# Patient Record
Sex: Male | Born: 1985
Health system: Southern US, Community
[De-identification: ages and names within clinical notes are randomized; demographics above are authoritative.]

## PROBLEM LIST (undated history)

## (undated) DIAGNOSIS — I219 Acute myocardial infarction, unspecified: Secondary | ICD-10-CM

## (undated) DIAGNOSIS — K219 Gastro-esophageal reflux disease without esophagitis: Secondary | ICD-10-CM

## (undated) DIAGNOSIS — R079 Chest pain, unspecified: Secondary | ICD-10-CM

## (undated) DIAGNOSIS — R002 Palpitations: Secondary | ICD-10-CM

## (undated) DIAGNOSIS — R9431 Abnormal electrocardiogram [ECG] [EKG]: Secondary | ICD-10-CM

## (undated) DIAGNOSIS — Z9289 Personal history of other medical treatment: Secondary | ICD-10-CM

## (undated) HISTORY — DX: Abnormal electrocardiogram (ECG) (EKG): R94.31

## (undated) HISTORY — DX: Morbid (severe) obesity due to excess calories: E66.01

## (undated) HISTORY — DX: Palpitations: R00.2

## (undated) HISTORY — DX: Chest pain, unspecified: R07.9

## (undated) HISTORY — DX: Personal history of other medical treatment: Z92.89

## (undated) HISTORY — DX: Acute myocardial infarction, unspecified: I21.9

---

## 2005-03-06 DIAGNOSIS — I219 Acute myocardial infarction, unspecified: Secondary | ICD-10-CM

## 2005-03-06 HISTORY — DX: Acute myocardial infarction, unspecified: I21.9

## 2015-05-13 ENCOUNTER — Ambulatory Visit: Payer: 59 | Admitting: Family Medicine

## 2015-05-28 ENCOUNTER — Encounter: Payer: Self-pay | Admitting: Family Medicine

## 2015-05-28 ENCOUNTER — Ambulatory Visit (INDEPENDENT_AMBULATORY_CARE_PROVIDER_SITE_OTHER): Payer: 59 | Admitting: Family Medicine

## 2015-05-28 VITALS — BP 114/72 | HR 70 | Temp 98.1°F | Ht 69.5 in | Wt 275.4 lb

## 2015-05-28 DIAGNOSIS — I252 Old myocardial infarction: Secondary | ICD-10-CM

## 2015-05-28 DIAGNOSIS — J069 Acute upper respiratory infection, unspecified: Secondary | ICD-10-CM

## 2015-05-28 DIAGNOSIS — Z130 Encounter for screening for diseases of the blood and blood-forming organs and certain disorders involving the immune mechanism: Secondary | ICD-10-CM | POA: Diagnosis not present

## 2015-05-28 DIAGNOSIS — E669 Obesity, unspecified: Secondary | ICD-10-CM | POA: Diagnosis not present

## 2015-05-28 NOTE — Assessment & Plan Note (Signed)
BMI of 40. Patient reports loss of 30 pounds recently. Is working diligently at diet and exercise. Patient will continue to work at diet and exercise. We will check screening lab work. He'll follow-up in 3 months to see how he is progressing.

## 2015-05-28 NOTE — Assessment & Plan Note (Signed)
Resolved. We'll monitor for recurrence.

## 2015-05-28 NOTE — Patient Instructions (Signed)
Nice to meet you. We will check lab work today as screening lab work. Please continue with diet and exercises you have been. You're doing a great job. If you develop chest pain, shortness of breath, sweatiness, or any new or changing symptoms please seek medical attention.

## 2015-05-28 NOTE — Assessment & Plan Note (Signed)
Patient reports history of MI at age 30. He had a reported negative catheterization. Felt to be related to decongestant use. He has not been followed by cardiologist since that time. Notes no issues since that time. No symptoms now. He will continue to monitor. We will obtain screening lab work. He is given return precautions.

## 2015-05-28 NOTE — Progress Notes (Signed)
Pre visit review using our clinic review tool, if applicable. No additional management support is needed unless otherwise documented below in the visit note. 

## 2015-05-28 NOTE — Progress Notes (Signed)
Patient ID: John Snow, male   DOB: [birth year], 30 y.o.   MRN: [id number]  John Rumps, MD Phone: [phone]  John Snow is a 30 y.o. male who presents today for new patient visit.  Patient presents to establish care. He reports he has a history of a MI at age 81. He developed chest pain, shortness of breath, numbness of his left arm and diaphoresis at that time and was evaluated in the emergency room. He states he was admitted and had a catheterization that revealed no blockages. He states they believe it might have been related to him being on decongestants at that time. He has not had any recurrent symptoms since that time. He exercises 3-4 times a week and has no symptoms.  Obesity: Patient is working very hard on losing weight. He states he is down 30 pounds. He has started to monitor his diet and is eating mostly vegetables and white meats. 3 meals a day. Eating healthy snacks in between. Maybe 1 sweet tea a month. Exercises 3-4 times a week at the gym. Works at [employer].  Sinus congestion: Patient notes about a month ago he had a week of sinus congestion and headaches. No fevers. No cough. Had rhinorrhea. Better on its own. No symptoms at this time.  Active Ambulatory Problems    Diagnosis Date Noted  . History of MI (myocardial infarction) 05/28/2015  . Acute upper respiratory infection 05/28/2015  . Obesity 05/28/2015   Resolved Ambulatory Problems    Diagnosis Date Noted  . No Resolved Ambulatory Problems   Past Medical History  Diagnosis Date  . Heart attack ([employer]) 2007    Family History  Problem Relation Age of Onset  . Cancer Father 35    Not prostate cancer  . Hypertension Maternal Grandmother     Social History   Social History  . Marital Status: Married    Spouse Name: N/A  . Number of Children: N/A  . Years of Education: N/A   Occupational History  . Not on file.   Social History Main Topics  . Smoking status: Never Smoker   .  Smokeless tobacco: Not on file  . Alcohol Use: No  . Drug Use: No  . Sexual Activity: Not on file   Other Topics Concern  . Not on file   Social History Narrative  . No narrative on file    ROS   General:  Negative for nexplained weight loss, fever Skin: Negative for new or changing mole, sore that won't heal HEENT: Negative for trouble hearing, trouble seeing, ringing in ears, mouth sores, hoarseness, change in voice, dysphagia. CV:  Negative for chest pain, dyspnea, edema, palpitations Resp: Negative for cough, dyspnea, hemoptysis GI: Negative for nausea, vomiting, diarrhea, constipation, abdominal pain, melena, hematochezia. GU: Negative for dysuria, incontinence, urinary hesitance, hematuria, vaginal or penile discharge, polyuria, sexual difficulty, lumps in testicle or breasts MSK: Negative for muscle cramps or aches, joint pain or swelling Neuro: Negative for headaches, weakness, numbness, dizziness, passing out/fainting Psych: Negative for depression, anxiety, memory problems  Objective  Physical Exam Filed Vitals:   05/28/15 1418  BP: 114/72  Pulse: 70  Temp: 98.1 F (36.7 C)    BP Readings from Last 3 Encounters:  05/28/15 114/72   Wt Readings from Last 3 Encounters:  05/28/15 275 lb 6.4 oz (124.921 kg)    Physical Exam  Constitutional: He is well-developed, well-nourished, and in no distress.  HENT:  Head: Normocephalic and atraumatic.  Right Ear: External  ear normal.  Left Ear: External ear normal.  Mouth/Throat: Oropharynx is clear and moist. No oropharyngeal exudate.  Eyes: Conjunctivae are normal. Pupils are equal, round, and reactive to light.  Neck: Neck supple.  Cardiovascular: Normal rate and regular rhythm.  Exam reveals no gallop and no friction rub.   No murmur heard. Pulmonary/Chest: Effort normal and breath sounds normal. No respiratory distress. He has no wheezes. He has no rales.  Abdominal: Soft. Bowel sounds are normal. He exhibits no  distension. There is no tenderness. There is no rebound and no guarding.  Musculoskeletal: He exhibits no edema.  Lymphadenopathy:    He has no cervical adenopathy.  Neurological: He is alert. Gait normal.  Skin: Skin is warm and dry. He is not diaphoretic.  Psychiatric: Mood and affect normal.     Assessment/Plan:   History of MI (myocardial infarction) Patient reports history of MI at age 59. He had a reported negative catheterization. Felt to be related to decongestant use. He has not been followed by cardiologist since that time. Notes no issues since that time. No symptoms now. He will continue to monitor. We will obtain screening lab work. He is given return precautions.  Acute upper respiratory infection Resolved. We'll monitor for recurrence.  Obesity BMI of 40. Patient reports loss of 30 pounds recently. Is working diligently at diet and exercise. Patient will continue to work at diet and exercise. We will check screening lab work. He'll follow-up in 3 months to see how he is progressing.    Orders Placed This Encounter  Procedures  . Lipid Profile  . Comp Met (CMET)  . CBC  . HgB A1c  . TSH    John Rumps, MD [place]

## 2015-05-31 ENCOUNTER — Other Ambulatory Visit: Payer: 59

## 2015-06-30 NOTE — Progress Notes (Signed)
Patient has voicemail that has not been set up

## 2015-07-14 ENCOUNTER — Encounter: Payer: Self-pay | Admitting: *Deleted

## 2015-07-14 ENCOUNTER — Emergency Department
Admission: EM | Admit: 2015-07-14 | Discharge: 2015-07-14 | Disposition: A | Discharge status: Home / Self Care | Payer: 59 | Attending: Student | Admitting: Student

## 2015-07-14 DIAGNOSIS — I252 Old myocardial infarction: Secondary | ICD-10-CM | POA: Insufficient documentation

## 2015-07-14 DIAGNOSIS — Z8674 Personal history of sudden cardiac arrest: Secondary | ICD-10-CM | POA: Diagnosis not present

## 2015-07-14 DIAGNOSIS — E669 Obesity, unspecified: Secondary | ICD-10-CM | POA: Insufficient documentation

## 2015-07-14 DIAGNOSIS — R42 Dizziness and giddiness: Secondary | ICD-10-CM | POA: Diagnosis present

## 2015-07-14 LAB — URINALYSIS COMPLETE WITH MICROSCOPIC (ARMC ONLY)
BILIRUBIN URINE: NEGATIVE
Bacteria, UA: NONE SEEN
GLUCOSE, UA: NEGATIVE mg/dL
KETONES UR: NEGATIVE mg/dL
Leukocytes, UA: NEGATIVE
NITRITE: NEGATIVE
PROTEIN: NEGATIVE mg/dL
SPECIFIC GRAVITY, URINE: 1.014 (ref 1.005–1.030)
pH: 6 (ref 5.0–8.0)

## 2015-07-14 LAB — CBC
HCT: 47.3 % (ref 40.0–52.0)
Hemoglobin: 15.6 g/dL (ref 13.0–18.0)
MCH: 29.6 pg (ref 26.0–34.0)
MCHC: 33.1 g/dL (ref 32.0–36.0)
MCV: 89.5 fL (ref 80.0–100.0)
PLATELETS: 288 10*3/uL (ref 150–440)
RBC: 5.29 MIL/uL (ref 4.40–5.90)
RDW: 13.3 % (ref 11.5–14.5)
WBC: 10.9 10*3/uL — ABNORMAL HIGH (ref 3.8–10.6)

## 2015-07-14 LAB — BASIC METABOLIC PANEL
ANION GAP: 8 (ref 5–15)
BUN: 21 mg/dL — ABNORMAL HIGH (ref 6–20)
CALCIUM: 9.5 mg/dL (ref 8.9–10.3)
CO2: 25 mmol/L (ref 22–32)
CREATININE: 1.08 mg/dL (ref 0.61–1.24)
Chloride: 104 mmol/L (ref 101–111)
GFR calc Af Amer: >60 mL/min (ref >60)
GLUCOSE: 104 mg/dL — AB (ref 65–99)
Potassium: 3.8 mmol/L (ref 3.5–5.1)
Sodium: 137 mmol/L (ref 135–145)

## 2015-07-14 LAB — GLUCOSE, CAPILLARY: Glucose-Capillary: 94 mg/dL (ref 65–99)

## 2015-07-14 LAB — TROPONIN I: Troponin I: <0.03 ng/mL (ref <0.031)

## 2015-07-14 NOTE — ED Provider Notes (Signed)
[facility] Emergency Department Provider Note   ____________________________________________  Time seen: Approximately 1:45 PM  I have reviewed the triage vital signs and the nursing notes.   HISTORY  Chief Complaint Dizziness and Weakness    HPI John Snow is a 30 y.o. male with history of obesity, history of "heart attack when I was 20" but he had a clean catheterization at that time and it was believed his troponin elevation was thought to be secondary to decongestant use, who presents for evaluation of resolved lightheadedness this morning, gradual onset, initially moderate, now resolved. Patient reports that last night he wore a sauna suit, he wanted to sweat in order to lose weight. This was while he was working third shift at [employer]. This morning after his shift he "didn't feel like myself". He was feeling quite lightheaded, laid down and then when he stood up again felt again lightheaded and like he couldn't catch his breath. He thinks he may be dehydrated. He has had no chest pain. No recent illness including no vomiting, diarrhea, fevers or chills.   Past Medical History  Diagnosis Date  . Heart attack [facility]) 2007    Patient Active Problem List   Diagnosis Date Noted  . History of MI (myocardial infarction) 05/28/2015  . Acute upper respiratory infection 05/28/2015  . Obesity 05/28/2015    History reviewed. No pertinent past surgical history.  No current outpatient prescriptions on file.  Allergies Review of patient's allergies indicates no known allergies.  Family History  Problem Relation Age of Onset  . Cancer Father 69    Not prostate cancer  . Hypertension Maternal Grandmother     Social History Social History  Substance Use Topics  . Smoking status: Never Smoker   . Smokeless tobacco: None  . Alcohol Use: No    Review of Systems Constitutional: No fever/chills Eyes: No visual changes. ENT: No sore  throat. Cardiovascular: Denies chest pain. Respiratory: + shortness of breath. Gastrointestinal: No abdominal pain.  No nausea, no vomiting.  No diarrhea.  No constipation. Genitourinary: Negative for dysuria. Musculoskeletal: Negative for back pain. Skin: Negative for rash. Neurological: Negative for headaches, focal weakness or numbness.  10-point ROS otherwise negative.  ____________________________________________   PHYSICAL EXAM:  VITAL SIGNS: ED Triage Vitals  Enc Vitals Group     BP 07/14/15 1014 109/80 mmHg     Pulse Rate 07/14/15 1014 98     Resp 07/14/15 1014 18     Temp 07/14/15 1014 98.5 F (36.9 C)     Temp Source 07/14/15 1014 Oral     SpO2 07/14/15 1014 98 %     Weight 07/14/15 1014 260 lb (117.935 kg)     Height 07/14/15 1014 5\' 10"  (1.778 m)     Head Cir --      Peak Flow --      Pain Score 07/14/15 1015 0     Pain Loc --      Pain Edu? --      Excl. in [place]? --     Constitutional: Alert and oriented. Well appearing and in no acute distress. Eyes: Conjunctivae are normal. PERRL. EOMI. Head: Atraumatic. Nose: No congestion/rhinnorhea. Mouth/Throat: Mucous membranes are moist.  Oropharynx non-erythematous. Neck: No stridor.  Supple without meningismus. Cardiovascular: Normal rate, regular rhythm. Grossly normal heart sounds.  Good peripheral circulation. Respiratory: Normal respiratory effort.  No retractions. Lungs CTAB. Gastrointestinal: Soft and nontender. No distention.  No CVA tenderness. Genitourinary: deferred Musculoskeletal: No  lower extremity tenderness nor edema.  No joint effusions. Neurologic:  Normal speech and language. No gross focal neurologic deficits are appreciated. No gait instability. Skin:  Skin is warm, dry and intact. No rash noted. Psychiatric: Mood and affect are normal. Speech and behavior are normal.  ____________________________________________   LABS (all labs ordered are listed, but only abnormal results are  displayed)  Labs Reviewed  BASIC METABOLIC PANEL - Abnormal; Notable for the following:    Glucose, Bld 104 (*)    BUN 21 (*)    All other components within normal limits  CBC - Abnormal; Notable for the following:    WBC 10.9 (*)    All other components within normal limits  URINALYSIS COMPLETEWITH MICROSCOPIC ([facility] ONLY) - Abnormal; Notable for the following:    Color, Urine STRAW (*)    APPearance CLEAR (*)    Hgb urine dipstick 1+ (*)    Squamous Epithelial / LPF 0-5 (*)    All other components within normal limits  GLUCOSE, CAPILLARY  TROPONIN I  CBG MONITORING, ED   ____________________________________________  EKG  ED ECG REPORT I, Joanne Gavel, the attending physician, personally viewed and interpreted this ECG.   Date: 07/14/2015  EKG Time: 10:19  Rate: 86  Rhythm: normal sinus rhythm with sinus arrhythmia  Axis: normal  Intervals:none  ST&T Change: No ST elevation MI. J-point elevation in V3, V4, V5, V6 likely secondary to benign early repolarization.  ____________________________________________  RADIOLOGY  none ____________________________________________   PROCEDURES  Procedure(s) performed: None  Critical Care performed: No  ____________________________________________   INITIAL IMPRESSION / ASSESSMENT AND PLAN / ED COURSE  Pertinent labs & imaging results that were available during my care of the patient were reviewed by me and considered in my medical decision making (see chart for details).  John Snow is a 30 y.o. male with history of obesity, history of "heart attack when I was 68" but he had a clean catheterization at that time and it was believed his troponin elevation was thought to be secondary to decongestant use who presents for evaluation of lightheadedness. Currently he reports his lightheadedness has resolved and he feels well. He is drinking water, sitting up in his hallway bed. His EKG is reassuring. CBC, BMP generally  unremarkable, negative troponin. Urinalysis is not consistent with infection. His breath sounds are clear, no hypoxia, no tachypnea, no increased work of breathing, I do not think he requires a chest x-ray. He denies any risk factors for PE and he his perc negative. I discussed with them that I suspect he is mildly dehydrated secondary to his use of the sauna suits/excessive sweating last night. I offered an IV for IV fluids however he has refused stating that he currently feels well and he wants to drink his own water. We discussed return precautions, new for close follow-up, adequate hydration, avoidance of sauna suits and he is comfortable with the discharge plan. DC home. ____________________________________________   FINAL CLINICAL IMPRESSION(S) / ED DIAGNOSES  Final diagnoses:  Lightheadedness      NEW MEDICATIONS STARTED DURING THIS VISIT:  There are no discharge medications for this patient.    Note:  This document was prepared using Dragon voice recognition software and may include unintentional dictation errors.    Joanne Gavel, MD 07/14/15 [id number]

## 2015-07-14 NOTE — ED Notes (Signed)
States this AM he felt dizzy and weak with his heart racing, states he works 3rd shift at [employer] and wore a sauna suit for 30 minutes last night and states he was drenched in sweat, pt awake and alert upon arrival

## 2015-07-14 NOTE — ED Notes (Signed)
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  Pt reports wearing a sauna suit last night at work for a while.  Pt reports he feels back to normal at this time.  Pt A/Ox4, vitals WDL, no immediate distress at this time.

## 2015-07-23 ENCOUNTER — Telehealth: Payer: Self-pay | Admitting: Family Medicine

## 2015-07-23 NOTE — Telephone Encounter (Signed)
Patient Name: John Snow DOB: 03-07-1985 Initial Comment Caller says last Thursday had blood work and was told all was fine, but that he had an anxiety attack and to drink more water. Did not get much sleep the night before. Just woke up, got up real fast; felt disoriented and dizzy like not fully awake but he is fine now. He congested but not having trouble breathing. Nurse Assessment Nurse: Vallery Sa, RN, [place] Date/Time (Eastern Time): 07/23/2015 2:38:18 PM Confirm and document reason for call. If symptomatic, describe symptoms. You must click the next button to save text entered. ---Caller states he didn't get much sleep last night. He had some dizziness and confusion today. No severe breathing or swallowing difficulty. No injury in the past 3 days. No fever. Has the patient traveled out of the country within the last 30 days? ---No Does the patient have any new or worsening symptoms? ---Yes Will a triage be completed? ---Yes Related visit to physician within the last 2 weeks? ---Yes Does the PT have any chronic conditions? (i.e. diabetes, asthma, etc.) ---Yes List chronic conditions. ---Close to dehydration 8 days ago (passed urine in the past 12 hours, moisture present on the inside of his mouth), Heart attack when he was 20(no chest pain) Is this a behavioral health or substance abuse call? ---No Guidelines Guideline Title Affirmed Question Affirmed Notes Confusion - Delirium [1] Acting confused (e.g., disoriented, slurred speech) AND [2] brief (now gone) Final Disposition User See Physician within 4 Hours (or PCP triage) Vallery Sa, RN, [place] Comments I tried to scheduled Hillel for the 3:15pm appointment with Thersa Salt. He states he has to check to see if someone else can pick up his daughter. He will call back or go to urgent care facility. Referrals [facility] Disagree/Comply: Comply

## 2015-07-23 NOTE — Telephone Encounter (Signed)
Patient never scheduled

## 2015-07-23 NOTE — Telephone Encounter (Signed)
Please advise. Thanks.  

## 2015-07-26 ENCOUNTER — Encounter: Payer: Self-pay | Admitting: Family Medicine

## 2015-07-26 ENCOUNTER — Ambulatory Visit (INDEPENDENT_AMBULATORY_CARE_PROVIDER_SITE_OTHER): Payer: 59 | Admitting: Family Medicine

## 2015-07-26 VITALS — BP 128/78 | HR 76 | Temp 98.3°F | Ht 70.0 in | Wt 276.0 lb

## 2015-07-26 DIAGNOSIS — R002 Palpitations: Secondary | ICD-10-CM | POA: Diagnosis not present

## 2015-07-26 NOTE — Progress Notes (Signed)
Patient ID: John Snow, male   DOB: 1985/07/18, 30 y.o.   MRN: [id number]  [name], MD Phone: [phone]  John Snow is a 30 y.o. male who presents today for hospital follow-up.  Patient was seen in the ED several weeks ago. He notes he went because he suddenly felt dizzy and had palpitations. Notes this followed him wearing a sauna suit to try to lose some weight. Notes he couldn't catch his breath at that time. He had negative workup in the ED. Possibly mild dehydration on BMP. No anemia. EKG with no ischemic changes. He notes this improved while he was in the emergency room. Did recur one day last week where he felt lightheaded and felt as though his heart was racing and as though it may have been hard to breathe. Notes he did feel dissociated at that time. Notes these symptoms resolve with going to sleep. He's not had any chest pain with these episodes. He denies depression and anxiety. Notes this is very similar to about 5 months ago when he has been wearing the sauna suit.  PMH: nonsmoker.   ROS see history of present illness  Objective  Physical Exam Filed Vitals:   07/26/15 1350  BP: 128/78  Pulse: 76  Temp: 98.3 F (36.8 C)    BP Readings from Last 3 Encounters:  07/26/15 128/78  07/14/15 120/85  05/28/15 114/72   Wt Readings from Last 3 Encounters:  07/26/15 276 lb (125.193 kg)  07/14/15 260 lb (117.935 kg)  05/28/15 275 lb 6.4 oz (124.921 kg)    Physical Exam  Constitutional: He is well-developed, well-nourished, and in no distress.  HENT:  Head: Normocephalic and atraumatic.  Mouth/Throat: Oropharynx is clear and moist. No oropharyngeal exudate.  Eyes: Conjunctivae are normal. Pupils are equal, round, and reactive to light.  Cardiovascular: Normal rate, regular rhythm and normal heart sounds.   Pulmonary/Chest: Effort normal and breath sounds normal.  Musculoskeletal: He exhibits no edema.  Neurological: He is alert. Gait normal.  Skin: Skin  is warm and dry. He is not diaphoretic.  Psychiatric: Mood and affect normal.     Assessment/Plan: Please see individual problem list.  Palpitations Patient with 2 occasions of dizziness, palpitations, and difficulty catching his breath. One of these followed wearing a sauna suit. The other followed getting little sleep. Workup in the emergency room was reassuring. No symptoms today. Discussed staying well hydrated. Discussed getting more sleep. Given report of palpitations and dizziness will refer to cardiology for evaluation of possible palpitations or arrhythmia as cause of symptoms. He will hold off on exercise until he has seen the cardiologist. He is given return precautions.    Orders Placed This Encounter  Procedures  . Ambulatory referral to Cardiology    Referral Priority:  Routine    Referral Type:  Consultation    Referral Reason:  Specialty Services Required    Requested Specialty:  Cardiology    Number of Visits Requested:  [place], MD [place]

## 2015-07-26 NOTE — Assessment & Plan Note (Signed)
Patient with 2 occasions of dizziness, palpitations, and difficulty catching his breath. One of these followed wearing a sauna suit. The other followed getting little sleep. Workup in the emergency room was reassuring. No symptoms today. Discussed staying well hydrated. Discussed getting more sleep. Given report of palpitations and dizziness will refer to cardiology for evaluation of possible palpitations or arrhythmia as cause of symptoms. He will hold off on exercise until he has seen the cardiologist. He is given return precautions.

## 2015-07-26 NOTE — Patient Instructions (Signed)
Nice to see you. Need to try to stay well hydrated. Do not wear a sauna suit again. Please try to increase your sleep to at least 6-7 hours a night. We will go to see a cardiologist for further evaluation. If you develop chest pain, shortness of breath, palpitations, or any new or changing symptoms please seek medical attention.

## 2015-07-26 NOTE — Progress Notes (Signed)
Pre visit review using our clinic review tool, if applicable. No additional management support is needed unless otherwise documented below in the visit note. 

## 2015-07-27 ENCOUNTER — Encounter: Payer: Self-pay | Admitting: Family Medicine

## 2015-08-05 ENCOUNTER — Telehealth: Payer: Self-pay | Admitting: *Deleted

## 2015-08-05 ENCOUNTER — Other Ambulatory Visit (INDEPENDENT_AMBULATORY_CARE_PROVIDER_SITE_OTHER): Payer: 59

## 2015-08-05 ENCOUNTER — Ambulatory Visit: Payer: 59 | Admitting: Cardiology

## 2015-08-05 DIAGNOSIS — E669 Obesity, unspecified: Secondary | ICD-10-CM

## 2015-08-05 DIAGNOSIS — Z13220 Encounter for screening for lipoid disorders: Secondary | ICD-10-CM

## 2015-08-05 LAB — LIPID PANEL
CHOLESTEROL: 191 mg/dL (ref 0–200)
HDL: 55.7 mg/dL (ref >39.00)
LDL Cholesterol: 111 mg/dL — ABNORMAL HIGH (ref 0–99)
NONHDL: 135.07
TRIGLYCERIDES: 122 mg/dL (ref 0.0–149.0)
Total CHOL/HDL Ratio: 3
VLDL: 24.4 mg/dL (ref 0.0–40.0)

## 2015-08-05 LAB — TSH: TSH: 1.65 u[IU]/mL (ref 0.35–4.50)

## 2015-08-05 NOTE — Telephone Encounter (Signed)
Order placed

## 2015-08-05 NOTE — Telephone Encounter (Signed)
Labs and dx?  

## 2015-08-12 ENCOUNTER — Ambulatory Visit (INDEPENDENT_AMBULATORY_CARE_PROVIDER_SITE_OTHER): Payer: 59 | Admitting: Cardiology

## 2015-08-12 ENCOUNTER — Encounter: Payer: Self-pay | Admitting: Cardiology

## 2015-08-12 DIAGNOSIS — R002 Palpitations: Secondary | ICD-10-CM | POA: Diagnosis not present

## 2015-08-12 NOTE — Patient Instructions (Addendum)
Medication Instructions:  Your physician recommends that you continue on your current medications as directed. Please refer to the Current Medication list given to you today.   Labwork: None ordered  Testing/Procedures: Your physician has recommended that you wear an event monitor. Event monitors are medical devices that record the heart's electrical activity. Doctors most often [place] these monitors to diagnose arrhythmias. Arrhythmias are problems with the speed or rhythm of the heartbeat. The monitor is a small, portable device. You can wear one while you do your normal daily activities. This is usually used to diagnose what is causing palpitations/syncope (passing out).  Will be mailed to you. They will call prior to verify mailing address.  Your physician has requested that you have an echocardiogram. Echocardiography is a painless test that uses sound waves to create images of your heart. It provides your doctor with information about the size and shape of your heart and how well your heart's chambers and valves are working. This procedure takes approximately one hour. There are no restrictions for this procedure.  Date & Time:____________________________________________________  Follow-Up: Your physician recommends that you schedule a follow-up appointment after testing to review results with Dr. Yvone Neu.  Date & Time: (Should be at least 45-48 days out to allow for event monitor results)  _______________________________________________________________________   Any Other Special Instructions Will Be Listed Below (If Applicable).     If you need a refill on your cardiac medications before your next appointment, please call your pharmacy.  Echocardiogram An echocardiogram, or echocardiography, uses sound waves (ultrasound) to produce an image of your heart. The echocardiogram is simple, painless, obtained within a short period of time, and offers valuable information to your health  care provider. The images from an echocardiogram can provide information such as:  Evidence of coronary artery disease (CAD).  Heart size.  Heart muscle function.  Heart valve function.  Aneurysm detection.  Evidence of a past heart attack.  Fluid buildup around the heart.  Heart muscle thickening.  Assess heart valve function. LET [facility] CARE PROVIDER KNOW ABOUT:  Any allergies you have.  All medicines you are taking, including vitamins, herbs, eye drops, creams, and over-the-counter medicines.  Previous problems you or members of your family have had with the use of anesthetics.  Any blood disorders you have.  Previous surgeries you have had.  Medical conditions you have.  Possibility of pregnancy, if this applies. BEFORE THE PROCEDURE  No special preparation is needed. Eat and drink normally.  PROCEDURE   In order to produce an image of your heart, gel will be applied to your chest and a wand-like tool (transducer) will be moved over your chest. The gel will help transmit the sound waves from the transducer. The sound waves will harmlessly bounce off your heart to allow the heart images to be captured in real-time motion. These images will then be recorded.  You may need an IV to receive a medicine that improves the quality of the pictures. AFTER THE PROCEDURE You may return to your normal schedule including diet, activities, and medicines, unless your health care provider tells you otherwise.   This information is not intended to replace advice given to you by your health care provider. Make sure you discuss any questions you have with your health care provider.   Document Released: 02/18/2000 Document Revised: 03/13/2014 Document Reviewed: 10/28/2012 Elsevier Interactive Patient Education 2016 [employer].   Cardiac Event Monitoring A cardiac event monitor is a small recording device used to  help detect abnormal heart rhythms (arrhythmias). The monitor  is used to record heart rhythm when noticeable symptoms such as the following occur:  Fast heartbeats (palpitations), such as heart racing or fluttering.  Dizziness.  Fainting or light-headedness.  Unexplained weakness. The monitor is wired to two electrodes placed on your chest. Electrodes are flat, sticky disks that attach to your skin. The monitor can be worn for up to 30 days. You will wear the monitor at all times, except when bathing.  HOW TO USE YOUR CARDIAC EVENT MONITOR A technician will prepare your chest for the electrode placement. The technician will show you how to place the electrodes, how to work the monitor, and how to replace the batteries. Take time to practice using the monitor before you leave the office. Make sure you understand how to send the information from the monitor to your health care provider. This requires a telephone with a landline, not a cell phone. You need to:  Wear your monitor at all times, except when you are in water:  Do not get the monitor wet.  Take the monitor off when bathing. Do not swim or use a hot tub with it on.  Keep your skin clean. Do not put body lotion or moisturizer on your chest.  Change the electrodes daily or any time they stop sticking to your skin. You might need to use tape to keep them on.  It is possible that your skin under the electrodes could become irritated. To keep this from happening, try to put the electrodes in slightly different places on your chest. However, they must remain in the area under your left breast and in the upper right section of your chest.  Make sure the monitor is safely clipped to your clothing or in a location close to your body that your health care provider recommends.  Press the button to record when you feel symptoms of heart trouble, such as dizziness, weakness, light-headedness, palpitations, thumping, shortness of breath, unexplained weakness, or a fluttering or racing heart. The monitor is  always on and records what happened slightly before you pressed the button, so do not worry about being too late to get good information.  Keep a diary of your activities, such as walking, doing chores, and taking medicine. It is especially important to note what you were doing when you pushed the button to record your symptoms. This will help your health care provider determine what might be contributing to your symptoms. The information stored in your monitor will be reviewed by your health care provider alongside your diary entries.  Send the recorded information as recommended by your health care provider. It is important to understand that it will take some time for your health care provider to process the results.  Change the batteries as recommended by your health care provider. SEEK IMMEDIATE MEDICAL CARE IF:   You have chest pain.  You have extreme difficulty breathing or shortness of breath.  You develop a very fast heartbeat that persists.  You develop dizziness that does not go away.  You faint or constantly feel you are about to faint.   This information is not intended to replace advice given to you by your health care provider. Make sure you discuss any questions you have with your health care provider.   Document Released: 11/30/2007 Document Revised: 03/13/2014 Document Reviewed: 08/19/2012 Elsevier Interactive Patient Education [employer].

## 2015-08-12 NOTE — Progress Notes (Signed)
Cardiology Office Note   Date:  08/12/2015   ID:  John Snow, DOB 03-Jul-1985, MRN [id number]  Referring Doctor:  [name], MD   Cardiologist:   Wende Bushy, MD   Reason for consultation:  Chief Complaint  Patient presents with  . Palpitations   New consultation   History of Present Illness: John Snow is a 30 y.o. male who presents for Palpitations.  PCP is referring patient for evaluation for possible arrhythmia. He presented with episodes of palpitations and dizziness, associated with wearing a sauna suit. Otherwise, he does not have chest pain or shortness of breath with physical activity. Palpitations are randomly occurring, short in duration, no recurrence since the last time he had them back in May. Symptoms mainly in the chest nonradiating. Moderate in severity.  In terms of his history of heart attack when he was 30 years old, patient reports that he had chest pain at that time. He was told that he was having a heart attack patient EKG. He remembers having had a heart cath done in was told that his arteries were clean. He was not placed on any long-term medications.  No fever, cough, colds, abdominal pain. No PND, orthopnea, edema.   ROS:  Please see the history of present illness. Aside from mentioned under HPI, all other systems are reviewed and negative.     Past Medical History  Diagnosis Date  . Heart attack [facility]) 2007    History reviewed. No pertinent past surgical history.   reports that he has never smoked. He does not have any smokeless tobacco history on file. He reports that he does not drink alcohol or use illicit drugs.   family history includes Cancer (age of onset: 19) in his father; Hypertension in his maternal grandmother. No history of CAD or SCD and family  No current outpatient prescriptions on file.   No current facility-administered medications for this visit.    Allergies: Review of patient's allergies indicates no  known allergies.    PHYSICAL EXAM: VS:  BP 124/76 mmHg  Pulse 78  Ht 5\' 10"  (1.778 m)  Wt 273 lb 12.8 oz (124.195 kg)  BMI 39.29 kg/m2 , Body mass index is 39.29 kg/(m^2). Wt Readings from Last 3 Encounters:  08/12/15 273 lb 12.8 oz (124.195 kg)  07/26/15 276 lb (125.193 kg)  07/14/15 260 lb (117.935 kg)    GENERAL:  well developed, well nourished, obese, not in acute distress HEENT: normocephalic, pink conjunctivae, anicteric sclerae, no xanthelasma, normal dentition, oropharynx clear NECK:  no neck vein engorgement, JVP normal, no hepatojugular reflux, carotid upstroke brisk and symmetric, no bruit, no thyromegaly, no lymphadenopathy LUNGS:  good respiratory effort, clear to auscultation bilaterally CV:  PMI not displaced, no thrills, no lifts, S1 and S2 within normal limits, no palpable S3 or S4, no murmurs, no rubs, no gallops ABD:  Soft, nontender, nondistended, normoactive bowel sounds, no abdominal aortic bruit, no hepatomegaly, no splenomegaly MS: nontender back, no kyphosis, no scoliosis, no joint deformities EXT:  2+ DP/PT pulses, no edema, no varicosities, no cyanosis, no clubbing SKIN: warm, nondiaphoretic, normal turgor, no ulcers NEUROPSYCH: alert, oriented to person, place, and time, sensory/motor grossly intact, normal mood, appropriate affect  Recent Labs: 07/14/2015: BUN 21*; Creatinine, Ser 1.08; Hemoglobin 15.6; Platelets 288; Potassium 3.8; Sodium 137 08/05/2015: TSH 1.65   Lipid Panel    Component Value Date/Time   CHOL 191 08/05/2015 0929   TRIG 122.0 08/05/2015 0929   HDL 55.70 08/05/2015 0929  CHOLHDL 3 08/05/2015 0929   VLDL 24.4 08/05/2015 0929   LDLCALC 111* 08/05/2015 0929     Other studies Reviewed:  EKG:  The ekg from 08/12/2015 was personally reviewed by me and it revealed sinus rhythm, 80 BPM  Additional studies/ records that were reviewed personally reviewed by me today include: None available   ASSESSMENT AND  PLAN:  Palpitations Recommend cardiac event monitor for objective evaluation of possible arrhythmia.  History of heart attack at age 6 Per his recollection, and his unclear whether he truly had a heart attack if his heart cath revealed normal coronaries. He remembers that he was much bigger at that time. Doubt that this was a true ischemic event is his coronary angiogram revealed no stenosis or blockages. Advised patient about clarifying his history as it may have implications for a family history of premature CAD for his offsprings. Recommend echocardiogram.  Obesity Body mass index is 39.29 kg/(m^2).Marland Kitchen Recommend aggressive weight loss through diet and increased physical activity.    Current medicines are reviewed at length with the patient today.  The patient does not have concerns regarding medicines.  Labs/ tests ordered today include:  Orders Placed This Encounter  Procedures  . Cardiac event monitor  . EKG 12-Lead  . ECHOCARDIOGRAM COMPLETE    I had a lengthy and detailed discussion with the patient regarding diagnoses, prognosis, diagnostic options, treatment options.   I counseled the patient on importance of lifestyle modification including heart healthy diet, regular physical activity    Disposition:   FU with undersigned after tests    Signed, Wende Bushy, MD  08/12/2015 4:34 PM    [place]

## 2015-08-24 ENCOUNTER — Other Ambulatory Visit: Payer: Self-pay | Admitting: Cardiology

## 2015-08-24 DIAGNOSIS — I252 Old myocardial infarction: Secondary | ICD-10-CM

## 2015-08-24 DIAGNOSIS — R002 Palpitations: Secondary | ICD-10-CM

## 2015-08-24 DIAGNOSIS — R42 Dizziness and giddiness: Secondary | ICD-10-CM

## 2015-08-27 ENCOUNTER — Other Ambulatory Visit: Payer: Self-pay

## 2015-08-27 ENCOUNTER — Ambulatory Visit (INDEPENDENT_AMBULATORY_CARE_PROVIDER_SITE_OTHER): Payer: 59

## 2015-08-27 DIAGNOSIS — R42 Dizziness and giddiness: Secondary | ICD-10-CM

## 2015-08-27 DIAGNOSIS — R002 Palpitations: Secondary | ICD-10-CM | POA: Diagnosis not present

## 2015-08-27 DIAGNOSIS — I252 Old myocardial infarction: Secondary | ICD-10-CM

## 2015-08-27 LAB — ECHOCARDIOGRAM COMPLETE
AOASC: 30 cm
AVLVOTPG: 8 mmHg
E decel time: 239 msec
EERAT: 5.6
FS: 31 % (ref 28–44)
IVS/LV PW RATIO, ED: 0.99
LA diam end sys: 35 mm
LA diam index: 1.38 cm/m2
LA vol A4C: 58.4 ml
LA vol index: 24.5 mL/m2
LASIZE: 35 mm
LAVOL: 62.1 mL
LV E/e' medial: 5.6
LV TDI E'LATERAL: 16.2
LV e' LATERAL: 16.2 cm/s
LVEEAVG: 5.6
LVOT VTI: 23 cm
LVOT area: 3.8 cm2
LVOT diameter: 22 mm
LVOTPV: 137 cm/s
LVOTSV: 87 mL
MV Dec: 239
MV pk A vel: 58.7 m/s
MV pk E vel: 90.7 m/s
MVPG: 3 mmHg
PW: 13.4 mm — AB (ref 0.6–1.1)
RV TAPSE: 30 mm
TDI e' medial: 8.81

## 2015-09-02 ENCOUNTER — Ambulatory Visit (INDEPENDENT_AMBULATORY_CARE_PROVIDER_SITE_OTHER): Payer: 59 | Admitting: Family Medicine

## 2015-09-02 ENCOUNTER — Encounter: Payer: Self-pay | Admitting: Family Medicine

## 2015-09-02 VITALS — BP 110/66 | HR 106 | Temp 99.8°F | Ht 70.0 in | Wt 278.0 lb

## 2015-09-02 DIAGNOSIS — J029 Acute pharyngitis, unspecified: Secondary | ICD-10-CM | POA: Diagnosis not present

## 2015-09-02 LAB — POCT RAPID STREP A (OFFICE): Rapid Strep A Screen: NEGATIVE

## 2015-09-02 MED ORDER — AMOXICILLIN 500 MG PO CAPS: 500.0000 mg | ORAL_CAPSULE | Freq: Two times a day (BID) | ORAL | Status: DC

## 2015-09-02 NOTE — Progress Notes (Signed)
Patient ID: John Snow, male   DOB: Jan 01, 1986, 30 y.o.   MRN: [id number]  John Rumps, MD Phone: [phone]  John Snow is a 30 y.o. male who presents today for same-day visit.  Notes started yesterday with sore itchy throat. Developed fevers and chills following this. Throat progressively got worse. No cough congestion and shortness of breath or chest pain. Does note sick contacts at work. Took a dose of Z-Pak yesterday that was not his. Ibuprofen and cold relief as well. No issues with breathing or swallowing. Has a history of strep throat in the past.  PMH: nonsmoker.   ROS see history of present illness  Objective  Physical Exam Filed Vitals:   09/02/15 0825  BP: 110/66  Pulse: 106  Temp: 99.8 F (37.7 C)    BP Readings from Last 3 Encounters:  09/02/15 110/66  08/12/15 124/76  07/26/15 128/78   Wt Readings from Last 3 Encounters:  09/02/15 278 lb (126.1 kg)  08/12/15 273 lb 12.8 oz (124.195 kg)  07/26/15 276 lb (125.193 kg)    Physical Exam  Constitutional: He is well-developed, well-nourished, and in no distress.  He appears ill though nontoxic  HENT:  Head: Normocephalic and atraumatic.  Right Ear: External ear normal.  Left Ear: External ear normal.  1+ tonsils with mild exudate noted bilaterally  Eyes: Conjunctivae are normal. Pupils are equal, round, and reactive to light.  Neck: Neck supple.  Cardiovascular: Normal rate, regular rhythm and normal heart sounds.   Pulmonary/Chest: Effort normal and breath sounds normal.  Abdominal: Soft. Bowel sounds are normal. He exhibits no distension. There is no tenderness. There is no rebound and no guarding.  Lymphadenopathy:    He has no cervical adenopathy.  Neurological: He is alert. Gait normal.  Skin: Skin is warm and dry. He is not diaphoretic.     Assessment/Plan: Please see individual problem list.  Sore throat Patient with sore throat consistent with pharyngitis. Exam and history would  argue for strep throat though rapid strep was negative. Given appearance on exam and history of strep throat in the past I discussed options for monitoring versus treatment and sending for culture. Could be that the dose of Z-Pak he took yesterday was enough to suppress the rapid test. Patient opted for antibiotic treatment and sending for the culture. We will treat with amoxicillin. Strep culture will be sent. Ibuprofen 800 mg every 8 hours and salt water gargles for discomfort. He'll continue to monitor. He is given return precautions.    Orders Placed This Encounter  Procedures  . Culture, Group A Strep    Order Specific Question:  Source    Answer:  throat  . POCT rapid strep A    Meds ordered this encounter  Medications  . amoxicillin (AMOXIL) 500 MG capsule    Sig: Take 1 capsule (500 mg total) by mouth 2 (two) times daily.    Dispense:  20 capsule    Refill:  0    John Rumps, MD [place]

## 2015-09-02 NOTE — Assessment & Plan Note (Addendum)
Patient with sore throat consistent with pharyngitis. Exam and history would argue for strep throat though rapid strep was negative. Given appearance on exam and history of strep throat in the past I discussed options for monitoring versus treatment and sending for culture. Could be that the dose of Z-Pak he took yesterday was enough to suppress the rapid test. Patient opted for antibiotic treatment and sending for the culture. We will treat with amoxicillin. Strep culture will be sent. Ibuprofen 800 mg every 8 hours and salt water gargles for discomfort. He'll continue to monitor. He is given return precautions.

## 2015-09-02 NOTE — Patient Instructions (Addendum)
Nice to see you. We'll start you on amoxicillin for your sore throat. Given your symptoms you most likely have strep throat though we need to send for culture. If you develop worsening sore throat, difficulty swallowing, persistent fevers, or any new or change in symptoms please seek medical attention.

## 2015-09-02 NOTE — Progress Notes (Signed)
Pre visit review using our clinic review tool, if applicable. No additional management support is needed unless otherwise documented below in the visit note. 

## 2015-09-04 LAB — CULTURE, GROUP A STREP: Organism ID, Bacteria: NORMAL

## 2015-09-06 ENCOUNTER — Telehealth: Payer: Self-pay | Admitting: *Deleted

## 2015-09-06 NOTE — Telephone Encounter (Signed)
Spoke with patient, see results note. thanks

## 2015-09-06 NOTE — Telephone Encounter (Signed)
Patient requested lab results Pt contact [phone]

## 2015-09-28 ENCOUNTER — Telehealth: Payer: Self-pay | Admitting: *Deleted

## 2015-09-28 DIAGNOSIS — R42 Dizziness and giddiness: Secondary | ICD-10-CM

## 2015-09-28 NOTE — Telephone Encounter (Signed)
Patient has requested a referral to the ear nose and throat. He stated that he still have the same symptoms when he had his last appt. When he turns his head quickly he experiences dizziness,he also has itchy ears.  Pt contact [phone]

## 2015-09-28 NOTE — Telephone Encounter (Signed)
Patient was seen on 6/29, wants referral to ENT, Please advise, thanks

## 2015-09-29 NOTE — Telephone Encounter (Signed)
Attempted to reach, not able to leave a VM. thanks

## 2015-09-29 NOTE — Telephone Encounter (Signed)
I'll place a referral although I can also see the patient back in the office if he would like. Referral has been placed.

## 2015-09-29 NOTE — Telephone Encounter (Signed)
Pt notified of Dr. Caryl Bis statement.

## 2015-10-01 ENCOUNTER — Encounter: Payer: Self-pay | Admitting: Family Medicine

## 2015-10-21 ENCOUNTER — Telehealth: Payer: Self-pay | Admitting: Cardiology

## 2015-10-21 NOTE — Telephone Encounter (Signed)
Patient has appointment coming up and was checking to see if all results and testing have been done. I do not see results for event monitor. Checked with [employer] and they sent monitor to the patient but he just never wore it and sent it back to them. So there is no results available for this testing.

## 2015-10-27 NOTE — Progress Notes (Signed)
Cardiology Office Note   Date:  10/28/2015   ID:  John Snow, DOB 04-18-1985, MRN [id number]  Referring Doctor:  [name], MD   Cardiologist:   Wende Bushy, MD   Reason for consultation:  Chief Complaint  Patient presents with  . Follow-up    no cp, no sob or swelling. no other complaints.   Follow-up after testing   History of Present Illness: John Snow is a 30 y.o. male who presents follow-up after testing.  Patient decided to hold off on Holter monitor since his palpitations eventually resolved. He does not have any complaints of chest pain or shortness of breath.   ROS:  Please see the history of present illness. Aside from mentioned under HPI, all other systems are reviewed and negative.     Past Medical History:  Diagnosis Date  . Heart attack [facility]) 2007    History reviewed. No pertinent surgical history.   reports that he has never smoked. He does not have any smokeless tobacco history on file. He reports that he does not drink alcohol or use drugs.   family history includes Cancer (age of onset: 33) in his father; Hypertension in his maternal grandmother. No history of CAD or SCD and family  No current outpatient prescriptions on file.   No current facility-administered medications for this visit.     Allergies: Review of patient's allergies indicates no known allergies.    PHYSICAL EXAM: VS:  BP 118/80 (BP Location: Left Arm, Patient Position: Sitting, Cuff Size: Normal)   Pulse 88   Ht 5\' 10"  (1.778 m)   Wt 267 lb 1.9 oz (121.2 kg)   BMI 38.33 kg/m  , Body mass index is 38.33 kg/m. Wt Readings from Last 3 Encounters:  10/28/15 267 lb 1.9 oz (121.2 kg)  09/02/15 278 lb (126.1 kg)  08/12/15 273 lb 12.8 oz (124.2 kg)    GENERAL:  well developed, well nourished, obese, not in acute distress HEENT: normocephalic, pink conjunctivae, anicteric sclerae, no xanthelasma, normal dentition, oropharynx clear NECK:  no neck vein  engorgement, JVP normal, no hepatojugular reflux, carotid upstroke brisk and symmetric, no bruit, no thyromegaly, no lymphadenopathy LUNGS:  good respiratory effort, clear to auscultation bilaterally CV:  PMI not displaced, no thrills, no lifts, S1 and S2 within normal limits, no palpable S3 or S4, no murmurs, no rubs, no gallops ABD:  Soft, nontender, nondistended, normoactive bowel sounds, no abdominal aortic bruit, no hepatomegaly, no splenomegaly MS: nontender back, no kyphosis, no scoliosis, no joint deformities EXT:  2+ DP/PT pulses, no edema, no varicosities, no cyanosis, no clubbing SKIN: warm, nondiaphoretic, normal turgor, no ulcers NEUROPSYCH: alert, oriented to person, place, and time, sensory/motor grossly intact, normal mood, appropriate affect  Recent Labs: 07/14/2015: BUN 21; Creatinine, Ser 1.08; Hemoglobin 15.6; Platelets 288; Potassium 3.8; Sodium 137 08/05/2015: TSH 1.65   Lipid Panel    Component Value Date/Time   CHOL 191 08/05/2015 0929   TRIG 122.0 08/05/2015 0929   HDL 55.70 08/05/2015 0929   CHOLHDL 3 08/05/2015 0929   VLDL 24.4 08/05/2015 0929   LDLCALC 111 (H) 08/05/2015 0929     Other studies Reviewed:  EKG:  The ekg from 08/12/2015 was personally reviewed by me and it revealed sinus rhythm, 80 BPM  Additional studies/ records that were reviewed personally reviewed by me today include:  Echo 08/27/2015: Left ventricle: The cavity size was normal. Systolic function was   normal. The estimated ejection fraction was in the range of  55%   to 60%. Wall motion was normal; there were no regional wall   motion abnormalities. Left ventricular diastolic function   parameters were normal. - Left atrium: The atrium was normal in size. - Right ventricle: Systolic function was normal. - Pulmonary arteries: Systolic pressure was within the normal   range.  Impressions:  - Normal study.  ASSESSMENT AND PLAN:  Palpitations Patient declined event monitor since  he has had no recurrence. Patient to let us know if he has any more palpitations. Patient in agreement.   History of heart attack at age 59 Per his recollection, and his unclear whether he truly had a heart attack if his heart cath revealed normal coronaries. He remembers that he was much bigger at that time. Doubt that this was a true ischemic event is his coronary angiogram revealed no stenosis or blockages. Advised patient about clarifying his history as it may have implications for a family history of premature CAD for his offsprings. Discussed findings of echocardiogram. This revealed normal LVEF with no wall motion abnormality. Patient reassured.  Obesity Body mass index is 38.33 kg/m.Marland Kitchen Recommend aggressive weight loss through diet and increased physical activity.    Current medicines are reviewed at length with the patient today.  The patient does not have concerns regarding medicines.  Labs/ tests ordered today include:  No orders of the defined types were placed in this encounter.   I had a lengthy and detailed discussion with the patient regarding diagnoses, prognosis, diagnostic options, treatment options.   I counseled the patient on importance of lifestyle modification including heart healthy diet, regular physical activity    Disposition:   FU with undersigned prn   Signed, Wende Bushy, MD  10/28/2015 4:29 PM    [place]

## 2015-10-28 ENCOUNTER — Ambulatory Visit (INDEPENDENT_AMBULATORY_CARE_PROVIDER_SITE_OTHER): Payer: 59 | Admitting: Cardiology

## 2015-10-28 ENCOUNTER — Encounter: Payer: Self-pay | Admitting: Cardiology

## 2015-10-28 DIAGNOSIS — R002 Palpitations: Secondary | ICD-10-CM | POA: Diagnosis not present

## 2015-10-28 NOTE — Patient Instructions (Signed)
Follow-Up: Your physician recommends that you schedule a follow-up appointment as needed with Dr. Ingal.   It was a pleasure seeing you today here in the office. Please do not hesitate to give us a call back if you have any further questions. [phone]  Marshay Slates A. RN, BSN     

## 2015-12-21 ENCOUNTER — Encounter: Payer: Self-pay | Admitting: Family Medicine

## 2015-12-27 ENCOUNTER — Encounter: Payer: Self-pay | Admitting: Family Medicine

## 2015-12-27 ENCOUNTER — Ambulatory Visit (INDEPENDENT_AMBULATORY_CARE_PROVIDER_SITE_OTHER): Payer: 59 | Admitting: Family Medicine

## 2015-12-27 VITALS — BP 122/84 | HR 84 | Temp 98.1°F | Wt 259.4 lb

## 2015-12-27 DIAGNOSIS — Z6838 Body mass index (BMI) 38.0-38.9, adult: Secondary | ICD-10-CM

## 2015-12-27 DIAGNOSIS — E6609 Other obesity due to excess calories: Secondary | ICD-10-CM

## 2015-12-27 DIAGNOSIS — G4726 Circadian rhythm sleep disorder, shift work type: Secondary | ICD-10-CM | POA: Insufficient documentation

## 2015-12-27 DIAGNOSIS — J309 Allergic rhinitis, unspecified: Secondary | ICD-10-CM | POA: Diagnosis not present

## 2015-12-27 DIAGNOSIS — Z8042 Family history of malignant neoplasm of prostate: Secondary | ICD-10-CM | POA: Diagnosis not present

## 2015-12-27 LAB — COMPREHENSIVE METABOLIC PANEL
ALK PHOS: 40 U/L (ref 39–117)
ALT: 11 U/L (ref 0–53)
AST: 14 U/L (ref 0–37)
Albumin: 4.6 g/dL (ref 3.5–5.2)
BUN: 13 mg/dL (ref 6–23)
CO2: 28 meq/L (ref 19–32)
Calcium: 9.6 mg/dL (ref 8.4–10.5)
Chloride: 102 mEq/L (ref 96–112)
Creatinine, Ser: 1.09 mg/dL (ref 0.40–1.50)
GFR: 101.17 mL/min (ref >60.00)
GLUCOSE: 98 mg/dL (ref 70–99)
POTASSIUM: 3.9 meq/L (ref 3.5–5.1)
SODIUM: 137 meq/L (ref 135–145)
TOTAL PROTEIN: 7.8 g/dL (ref 6.0–8.3)
Total Bilirubin: 0.7 mg/dL (ref 0.2–1.2)

## 2015-12-27 LAB — HEMOGLOBIN A1C: Hgb A1c MFr Bld: 5.6 % (ref 4.6–6.5)

## 2015-12-27 LAB — PSA: PSA: 0.45 ng/mL (ref 0.10–4.00)

## 2015-12-27 MED ORDER — LORATADINE 10 MG PO TABS: 10.0000 mg | ORAL_TABLET | Freq: Every day | ORAL | 11 refills | Status: DC

## 2015-12-27 MED ORDER — FLUTICASONE PROPIONATE 50 MCG/ACT NA SUSP: 2.0000 | Freq: Every day | NASAL | 1 refills | Status: DC

## 2015-12-27 NOTE — Progress Notes (Signed)
Pre visit review using our clinic review tool, if applicable. No additional management support is needed unless otherwise documented below in the visit note. 

## 2015-12-27 NOTE — Assessment & Plan Note (Signed)
Patient's father died from prostate cancer at age 31. Patient trying to get testing today. Normal prostate exam. We'll check a PSA.

## 2015-12-27 NOTE — Patient Instructions (Addendum)
Nice to see you. We'll check some lab work and call you with the results. You should try Claritin and Flonase daily for several weeks to see if that improves your sinus issues. You can try melatonin 2 mg 30 minutes prior to going to sleep to see if this will benefit her sleep cycle. If this does not help please let us know.

## 2015-12-27 NOTE — Progress Notes (Signed)
[name], MD Phone: [phone]  John Snow is a 30 y.o. male who presents today for follow-up.  Allergic rhinitis: Patient notes continued upper respiratory symptoms. He was previously sent to ENT who he states looked at his ears and ran several tests and told him there is nothing wrong with his ears to be causing his lightheadedness. Notes occasionally he will get a little dizzy. Notes persistent nasal and frontal sinus pressure and congestion. Also notes persistent postnasal drip and some mild cough. He notes he did have worsening symptoms about 2 weeks ago but they have returned to his baseline state. Has symptoms all the time. No fevers.  Shiftwork disorder: Patient notes he works nights Monday through Thursday. Tries to go to bed at 8 AM though finds himself waking up at 11:30 AM. He does not keep a similar schedule on the weekends. Typically getting 5 hours sleep a day. Has not tried melatonin. Does not drink much caffeine. Not much alcohol either.  He reports his father passed away from prostate cancer at age 73. Patient notes he would like testing for his prostate. He notes no prior prostate issues.  PMH: nonsmoker.   ROS see history of present illness  Objective  Physical Exam Vitals:   12/27/15 1426  BP: 122/84  Pulse: 84  Temp: 98.1 F (36.7 C)    BP Readings from Last 3 Encounters:  12/27/15 122/84  10/28/15 118/80  09/02/15 110/66   Wt Readings from Last 3 Encounters:  12/27/15 259 lb 6 oz (117.7 kg)  10/28/15 267 lb 1.9 oz (121.2 kg)  09/02/15 278 lb (126.1 kg)    Physical Exam  Constitutional: He is well-developed, well-nourished, and in no distress.  HENT:  Head: Normocephalic and atraumatic.  Mouth/Throat: Oropharynx is clear and moist. No oropharyngeal exudate.  Normal TMs bilaterally  Eyes: Conjunctivae are normal. Pupils are equal, round, and reactive to light.  Neck: Neck supple.  Cardiovascular: Normal rate, regular rhythm and normal  heart sounds.   Pulmonary/Chest: Effort normal and breath sounds normal.  Genitourinary: Rectum normal and prostate normal.  Lymphadenopathy:    He has no cervical adenopathy.  Neurological: He is alert. Gait normal.  Skin: Skin is warm and dry.     Assessment/Plan: Please see individual problem list.  Allergic rhinitis Patient with chronic symptoms most consistent with allergic rhinitis. No focal findings to indicate bacterial illness. Given duration doubt viral illness. Discussed nature of his symptoms. Encouraged daily Flonase and Claritin use. He'll monitor and if does not improve with this he'll let us know. Given return precautions.  Shift work sleep disorder Patient with shiftwork sleep disorder. Discussed sleep hygiene. He can try melatonin over-the-counter 2 mg 30 minutes prior to going to sleep to see if this will be beneficial. If not beneficial he may benefit from hypnotic medication.  Family history of prostate cancer in father Patient's father died from prostate cancer at age 70. Patient trying to get testing today. Normal prostate exam. We'll check a PSA.   Orders Placed This Encounter  Procedures  . PSA  . Comp Met (CMET)  . HgB A1c    Meds ordered this encounter  Medications  . DISCONTD: fluticasone (FLONASE) 50 MCG/ACT nasal spray    Sig: Place 2 sprays into both nostrils daily as needed for allergies or rhinitis.  . fluticasone (FLONASE) 50 MCG/ACT nasal spray    Sig: Place 2 sprays into both nostrils daily.    Dispense:  16 g    Refill:  1  . loratadine (CLARITIN) 10 MG tablet    Sig: Take 1 tablet (10 mg total) by mouth daily.    Dispense:  30 tablet    Refill:  [place], MD [place]

## 2015-12-27 NOTE — Assessment & Plan Note (Signed)
Patient with shiftwork sleep disorder. Discussed sleep hygiene. He can try melatonin over-the-counter 2 mg 30 minutes prior to going to sleep to see if this will be beneficial. If not beneficial he may benefit from hypnotic medication.

## 2015-12-27 NOTE — Assessment & Plan Note (Signed)
Patient with chronic symptoms most consistent with allergic rhinitis. No focal findings to indicate bacterial illness. Given duration doubt viral illness. Discussed nature of his symptoms. Encouraged daily Flonase and Claritin use. He'll monitor and if does not improve with this he'll let us know. Given return precautions.

## 2015-12-28 ENCOUNTER — Telehealth: Payer: Self-pay | Admitting: Family Medicine

## 2015-12-28 NOTE — Telephone Encounter (Signed)
Pt was notified of results

## 2015-12-28 NOTE — Telephone Encounter (Signed)
Pt called requesting lab results. Thank you!  Call pt @ [phone]

## 2016-01-19 ENCOUNTER — Encounter: Payer: Self-pay | Admitting: Family Medicine

## 2016-06-01 ENCOUNTER — Encounter: Payer: Self-pay | Admitting: Family Medicine

## 2016-06-01 ENCOUNTER — Ambulatory Visit (INDEPENDENT_AMBULATORY_CARE_PROVIDER_SITE_OTHER): Payer: 59 | Admitting: Family Medicine

## 2016-06-01 VITALS — BP 102/62 | HR 119 | Temp 98.7°F | Wt 266.1 lb

## 2016-06-01 DIAGNOSIS — J029 Acute pharyngitis, unspecified: Secondary | ICD-10-CM

## 2016-06-01 LAB — POC INFLUENZA A&B (BINAX/QUICKVUE)
Influenza A, POC: NEGATIVE
Influenza B, POC: NEGATIVE

## 2016-06-01 LAB — POCT RAPID STREP A (OFFICE): RAPID STREP A SCREEN: NEGATIVE

## 2016-06-01 MED ORDER — AZITHROMYCIN 250 MG PO TABS: ORAL_TABLET | ORAL | 0 refills | Status: DC

## 2016-06-01 NOTE — Assessment & Plan Note (Signed)
New problem. Strep negative. Likely viral. However, given tachycardia on exam I advised the patient to fill the antibiotic that was given if he fails to improve or worsens. Ibuprofen as needed.

## 2016-06-01 NOTE — Progress Notes (Signed)
Pre visit review using our clinic review tool, if applicable. No additional management support is needed unless otherwise documented below in the visit note. 

## 2016-06-01 NOTE — Patient Instructions (Signed)
This is likely viral.  If you worsen or fail to improve start the antibiotic.  Take care  Dr. Lacinda Axon

## 2016-06-01 NOTE — Progress Notes (Signed)
Subjective:  Patient ID: John Snow, male    DOB: [birth year]  Age: 31 y.o. MRN: [id number]  CC: ? Flu  HPI:  31 year old male presents with the above concern.  Patient states that he recently returned from [place] on Monday. He developed chills and diarrhea at that time. This quickly resolved. On Tuesday he developed "itchy" throat and congestion. Reports fever, Tmax 100.2. Reports his head is heavy and he feels weak.  Moderate in severity. No known exacerbating or relieving factors. No associated symptoms. No other complaints or concerns at this time.  Social Hx   Social History   Social History  . Marital status: Married    Spouse name: N/A  . Number of children: N/A  . Years of education: N/A   Social History Main Topics  . Smoking status: Never Smoker  . Smokeless tobacco: Never Used  . Alcohol use No  . Drug use: No  . Sexual activity: Not Asked   Other Topics Concern  . None   Social History Narrative  . None    Review of Systems  Constitutional: Positive for fever.  HENT: Positive for congestion and sore throat.   Gastrointestinal: Positive for diarrhea.   Objective:  BP 102/62   Pulse (!) 119   Temp 98.7 F (37.1 C) (Oral)   Wt 266 lb 2 oz (120.7 kg)   SpO2 95%   BMI 38.18 kg/m   BP/Weight 06/01/2016 12/27/2015 0/93/8182  Systolic BP 993 716 967  Diastolic BP 62 84 80  Wt. (Lbs) 266.13 259.38 267.12  BMI 38.18 37.22 38.33   Physical Exam  Constitutional: He is oriented to person, place, and time. He appears well-developed. No distress.  HENT:  Head: Normocephalic and atraumatic.  Oropharynx with moderate erythema. No appreciable tonsillar exudate.  Cardiovascular: Regular rhythm.  Tachycardia present.   Pulmonary/Chest: Effort normal and breath sounds normal. He has no wheezes. He has no rales.  Lymphadenopathy:    He has no cervical adenopathy.  Neurological: He is alert and oriented to person, place, and time.  Psychiatric: He has a normal  mood and affect.  Vitals reviewed.   Lab Results  Component Value Date   WBC 10.9 (H) 07/14/2015   HGB 15.6 07/14/2015   HCT 47.3 07/14/2015   PLT 288 07/14/2015   GLUCOSE 98 12/27/2015   CHOL 191 08/05/2015   TRIG 122.0 08/05/2015   HDL 55.70 08/05/2015   LDLCALC 111 (H) 08/05/2015   ALT 11 12/27/2015   AST 14 12/27/2015   NA 137 12/27/2015   K 3.9 12/27/2015   CL 102 12/27/2015   CREATININE 1.09 12/27/2015   BUN 13 12/27/2015   CO2 28 12/27/2015   TSH 1.65 08/05/2015   PSA 0.45 12/27/2015   HGBA1C 5.6 12/27/2015    Assessment & Plan:   Problem List Items Addressed This Visit    Pharyngitis - Primary    New problem. Strep negative. Likely viral. However, given tachycardia on exam I advised the patient to fill the antibiotic that was given if he fails to improve or worsens. Ibuprofen as needed.       Relevant Orders   POC Influenza A&B(BINAX/QUICKVUE) (Completed)   POCT rapid strep A (Completed)      Meds ordered this encounter  Medications  . azithromycin (ZITHROMAX) 250 MG tablet    Sig: 2 tablets on Day 1, then 1 tablet daily on days 2-5.    Dispense:  6 tablet    Refill:  0  Follow-up: PRN  [place]

## 2016-06-03 ENCOUNTER — Emergency Department: Payer: 59

## 2016-06-03 ENCOUNTER — Emergency Department
Admission: EM | Admit: 2016-06-03 | Discharge: 2016-06-03 | Disposition: A | Discharge status: Home / Self Care | Payer: 59 | Attending: Emergency Medicine | Admitting: Emergency Medicine

## 2016-06-03 DIAGNOSIS — R509 Fever, unspecified: Secondary | ICD-10-CM | POA: Diagnosis present

## 2016-06-03 DIAGNOSIS — J111 Influenza due to unidentified influenza virus with other respiratory manifestations: Secondary | ICD-10-CM

## 2016-06-03 DIAGNOSIS — Z79899 Other long term (current) drug therapy: Secondary | ICD-10-CM | POA: Insufficient documentation

## 2016-06-03 LAB — INFLUENZA PANEL BY PCR (TYPE A & B)
INFLBPCR: POSITIVE — AB
Influenza A By PCR: NEGATIVE

## 2016-06-03 MED ORDER — OSELTAMIVIR PHOSPHATE 75 MG PO CAPS: 75.0000 mg | ORAL_CAPSULE | Freq: Two times a day (BID) | ORAL | 0 refills | Status: DC

## 2016-06-03 NOTE — ED Provider Notes (Signed)
[facility] Emergency Department Provider Note   ____________________________________________   First MD Initiated Contact with Patient 06/03/16 [phone]     (approximate)  I have reviewed the triage vital signs and the nursing notes.   HISTORY  Chief Complaint Influenza    HPI John Snow is a 31 y.o. male who presents to the ED from home with a chief complaint of flulike symptoms. Patient reports returning from [place] 2 days ago with fever, congestion, nonproductive cough and body aches. Saw his PCP same day and was prescribed Z-Pak. Felt betteryesterday but by evening had recurrent fever to 103.29F. Called his doctor's office and was referred to the emergency department for further evaluation and influenza swab. Denies associated neck pain, chest pain, shortness of breath, abdominal pain, nausea, vomiting. Complains of several episodes of diarrhea and mild, frontal headache with fever which resolves with antipyretics.   Past Medical History:  Diagnosis Date  . Heart attack 2007    Patient Active Problem List   Diagnosis Date Noted  . Pharyngitis 06/01/2016  . Allergic rhinitis 12/27/2015  . Shift work sleep disorder 12/27/2015  . Family history of prostate cancer in father 12/27/2015  . Palpitations 07/26/2015  . History of MI (myocardial infarction) 05/28/2015  . Obesity 05/28/2015    No past surgical history on file.  Prior to Admission medications   Medication Sig Start Date End Date Taking? Authorizing Provider  azithromycin (ZITHROMAX) 250 MG tablet 2 tablets on Day 1, then 1 tablet daily on days 2-5. 06/01/16   Coral Spikes, DO  fluticasone (FLONASE) 50 MCG/ACT nasal spray Place 2 sprays into both nostrils daily. 12/27/15   Leone Haven, MD  loratadine (CLARITIN) 10 MG tablet Take 1 tablet (10 mg total) by mouth daily. 12/27/15   Leone Haven, MD  oseltamivir (TAMIFLU) 75 MG capsule Take 1 capsule (75 mg total) by mouth  2 (two) times daily. 06/03/16   Paulette Blanch, MD    Allergies Patient has no known allergies.  Family History  Problem Relation Age of Onset  . Cancer Father 17    Not prostate cancer  . Hypertension Maternal Grandmother     Social History Social History  Substance Use Topics  . Smoking status: Never Smoker  . Smokeless tobacco: Never Used  . Alcohol use No    Review of Systems  Constitutional: Positive for myalgias and fever/chills. Eyes: No visual changes. ENT: No sore throat. Cardiovascular: Denies chest pain. Respiratory: Positive for nonproductive cough. Denies shortness of breath. Gastrointestinal: No abdominal pain.  No nausea, no vomiting.  Positive for diarrhea.  No constipation. Genitourinary: Negative for dysuria. Musculoskeletal: Negative for back pain. Skin: Negative for rash. Neurological: Positive for headache. Negative for focal weakness or numbness.  10-point ROS otherwise negative.  ____________________________________________   PHYSICAL EXAM:  VITAL SIGNS: ED Triage Vitals  Enc Vitals Group     BP 06/03/16 0206 127/84     Pulse Rate 06/03/16 0206 (!) 104     Resp 06/03/16 0206 20     Temp 06/03/16 0206 98.8 F (37.1 C)     Temp Source 06/03/16 0206 Oral     SpO2 06/03/16 0206 100 %     Weight --      Height 06/03/16 0156 5\' 10"  (1.778 m)     Head Circumference --      Peak Flow --      Pain Score 06/03/16 0209 5     Pain Loc --  Pain Edu? --      Excl. in [place]? --     Constitutional: Alert and oriented. Well appearing and in no acute distress. Eyes: Conjunctivae are normal. PERRL. EOMI. Head: Atraumatic. Nose: Congestion/rhinnorhea. Mouth/Throat: Mucous membranes are moist.  Oropharynx mildly erythematous without tonsillar swelling, exudates or peritonsillar abscess. Neck: No stridor.  Supple neck without meningismus. Hematological/Lymphatic/Immunilogical: No cervical lymphadenopathy. Cardiovascular: Normal rate, regular rhythm.  Grossly normal heart sounds.  Good peripheral circulation. Respiratory: Normal respiratory effort.  No retractions. Lungs CTAB. Gastrointestinal: Soft and nontender. No distention. No abdominal bruits. No CVA tenderness. Musculoskeletal: No lower extremity tenderness nor edema.  No joint effusions. Neurologic:  Normal speech and language. No gross focal neurologic deficits are appreciated. No gait instability. Skin:  Skin is warm, dry and intact. No rash noted. No petechiae. Psychiatric: Mood and affect are normal. Speech and behavior are normal.  ____________________________________________   LABS (all labs ordered are listed, but only abnormal results are displayed)  Labs Reviewed  INFLUENZA PANEL BY PCR (TYPE A & B) - Abnormal; Notable for the following:       Result Value   Influenza B By PCR POSITIVE (*)    All other components within normal limits   ____________________________________________  EKG  None ____________________________________________  RADIOLOGY  Chest x-ray interpreted per Dr. Radene Knee: No acute cardiopulmonary process seen. ____________________________________________   PROCEDURES  Procedure(s) performed: None  Procedures  Critical Care performed: No  ____________________________________________   INITIAL IMPRESSION / ASSESSMENT AND PLAN / ED COURSE  Pertinent labs & imaging results that were available during my care of the patient were reviewed by me and considered in my medical decision making (see chart for details).  31 year old male who presents for flulike symptoms. Antipyretic taken prior to arrival. Patient currently afebrile, well-appearing in no acute distress. Will obtain chest x-ray and influenza swab.  Clinical Course as of Jun 03 325  Sat Jun 03, 2016  0323 Updated patient of imaging and laboratory results. Will discharge home with prescription for Tamiflu. Strict return precautions given. Patient verbalizes understanding and  agrees with plan of care.  [JS]    Clinical Course User Index [JS] Paulette Blanch, MD     ____________________________________________   FINAL CLINICAL IMPRESSION(S) / ED DIAGNOSES  Final diagnoses:  Influenza      NEW MEDICATIONS STARTED DURING THIS VISIT:  New Prescriptions   OSELTAMIVIR (TAMIFLU) 75 MG CAPSULE    Take 1 capsule (75 mg total) by mouth 2 (two) times daily.     Note:  This document was prepared using Dragon voice recognition software and may include unintentional dictation errors.    Paulette Blanch, MD 06/03/16 [phone]

## 2016-06-03 NOTE — ED Triage Notes (Signed)
Reports having flu like symptoms.  Seen at MD office on Thursday and given antibiotics.  Tonight with fever 103.4 at home.

## 2016-06-03 NOTE — ED Notes (Signed)
Pt reports to ED w/ c/o flu like s/s. Pt reports +PO intake, fever that has been responsive to antipyretics, diarrhea, h/a and nasal congestion.  Pt A/OX4, able to speak in full sentences w/o issue. NAD.

## 2016-06-03 NOTE — Discharge Instructions (Signed)
1. Take Tamiflu as prescribed. 2. Return to the ER for worsening symptoms, persistent vomiting, difficulty breathing or other concerns.

## 2016-06-05 ENCOUNTER — Telehealth: Payer: Self-pay | Admitting: *Deleted

## 2016-06-05 NOTE — Telephone Encounter (Signed)
11:15 tomorrow please block 11:30

## 2016-06-05 NOTE — Telephone Encounter (Signed)
Patient will need a ER follow up, please give a time and date. Pt requested an appt for 04/03 Pt contact [phone]

## 2016-06-05 NOTE — Telephone Encounter (Signed)
Scheduled

## 2016-06-06 ENCOUNTER — Encounter: Payer: Self-pay | Admitting: Family Medicine

## 2016-06-06 ENCOUNTER — Ambulatory Visit (INDEPENDENT_AMBULATORY_CARE_PROVIDER_SITE_OTHER): Payer: 59 | Admitting: Family Medicine

## 2016-06-06 VITALS — BP 100/60 | HR 86 | Temp 98.6°F | Wt 255.0 lb

## 2016-06-06 DIAGNOSIS — J101 Influenza due to other identified influenza virus with other respiratory manifestations: Secondary | ICD-10-CM | POA: Diagnosis not present

## 2016-06-06 LAB — COMPREHENSIVE METABOLIC PANEL
ALBUMIN: 4.4 g/dL (ref 3.5–5.2)
ALT: 16 U/L (ref 0–53)
AST: 23 U/L (ref 0–37)
Alkaline Phosphatase: 32 U/L — ABNORMAL LOW (ref 39–117)
BILIRUBIN TOTAL: 0.5 mg/dL (ref 0.2–1.2)
BUN: 9 mg/dL (ref 6–23)
CHLORIDE: 102 meq/L (ref 96–112)
CO2: 29 mEq/L (ref 19–32)
CREATININE: 1.14 mg/dL (ref 0.40–1.50)
Calcium: 9.4 mg/dL (ref 8.4–10.5)
GFR: 95.79 mL/min (ref >60.00)
GLUCOSE: 96 mg/dL (ref 70–99)
Potassium: 4.4 mEq/L (ref 3.5–5.1)
SODIUM: 137 meq/L (ref 135–145)
TOTAL PROTEIN: 8.4 g/dL — AB (ref 6.0–8.3)

## 2016-06-06 LAB — CBC
HCT: 48.6 % (ref 39.0–52.0)
Hemoglobin: 16.3 g/dL (ref 13.0–17.0)
MCHC: 33.6 g/dL (ref 30.0–36.0)
MCV: 90.6 fl (ref 78.0–100.0)
PLATELETS: 276 10*3/uL (ref 150.0–400.0)
RBC: 5.36 Mil/uL (ref 4.22–5.81)
RDW: 12.9 % (ref 11.5–15.5)
WBC: 8.9 10*3/uL (ref 4.0–10.5)

## 2016-06-06 NOTE — Progress Notes (Signed)
  [name], MD Phone: [phone]  John Snow is a 31 y.o. male who presents today for follow-up.  Patient evaluated in the emergency room last week and diagnosed with influenza. He has been taking Tamiflu. He notes cough develop after starting the Tamiflu. He notes a little nasal congestion though no significant sinus congestion or chest congestion. Cough is minimally productive of clear mucus. His fever has gone away. His body aches have improved. Occasionally is having night sweats with this. Has continued to have some diarrhea if he eats something heavy. No abdominal pain, vomiting, or nausea. No blood in his stool. He is taking in lots of fluids.  ROS see history of present illness  Objective  Physical Exam Vitals:   06/06/16 1118  BP: 100/60  Pulse: 86  Temp: 98.6 F (37 C)    BP Readings from Last 3 Encounters:  06/06/16 100/60  06/03/16 124/84  06/01/16 102/62   Wt Readings from Last 3 Encounters:  06/06/16 255 lb (115.7 kg)  06/01/16 266 lb 2 oz (120.7 kg)  12/27/15 259 lb 6 oz (117.7 kg)    Physical Exam  Constitutional: No distress.  HENT:  Head: Normocephalic and atraumatic.  Mouth/Throat: Oropharynx is clear and moist. No oropharyngeal exudate.  Normal TMs  Eyes: Conjunctivae are normal. Pupils are equal, round, and reactive to light.  Cardiovascular: Normal rate, regular rhythm and normal heart sounds.   Pulmonary/Chest: Effort normal and breath sounds normal.  Abdominal: Soft. Bowel sounds are normal. He exhibits no distension. There is no tenderness. There is no rebound and no guarding.  Neurological: He is alert. Gait normal.  Skin: Skin is warm and dry. He is not diaphoretic.     Assessment/Plan: Please see individual problem list.  Influenza B Patient diagnosed with influenza type B in the emergency room. Seen in our office last week for this as well. He has progressively been slowly improving. He still have cough and some congestion.  Also some sweats though no fevers. Continues to have intermittent diarrhea if he eats the wrong thing. Overall improved. Benign exam today. Reassured patient that this is likely all part of his current illness related to the influenza virus. Patient was concerned about the sweats and thus we will obtain a CMP and a CBC. He will finish his Tamiflu. He'll start Zyrtec for nasal congestion. Advised to have close contacts contact they are doctors to determine if prophylactic Tamiflu is needed. His weight is down about 6 pounds. Suspect this is related to his illness. He'll return in about a month for recheck of this. He is given return precautions.   Orders Placed This Encounter  Procedures  . Comp Met (CMET)  . CBC    [name], MD [place]

## 2016-06-06 NOTE — Patient Instructions (Signed)
Nice to see you. I am glad you are starting to feel better. You need to take in plenty of fluids. We'll check some lab work and contact you with the results. Please finish your Tamiflu. You can start on Zyrtec. If you develop worsening symptoms, cough productive of blood, fevers, blood in your stool, abdominal pain, or any new or changing symptoms please seek medical attention immediately.

## 2016-06-06 NOTE — Assessment & Plan Note (Addendum)
Patient diagnosed with influenza type B in the emergency room. Seen in our office last week for this as well. He has progressively been slowly improving. He still have cough and some congestion. Also some sweats though no fevers. Continues to have intermittent diarrhea if he eats the wrong thing. Overall improved. Benign exam today. Reassured patient that this is likely all part of his current illness related to the influenza virus. Patient was concerned about the sweats and thus we will obtain a CMP and a CBC. He will finish his Tamiflu. He'll start Zyrtec for nasal congestion. Advised to have close contacts contact they are doctors to determine if prophylactic Tamiflu is needed. His weight is down about 6 pounds. Suspect this is related to his illness. He'll return in about a month for recheck of this. He is given return precautions.

## 2016-06-06 NOTE — Progress Notes (Signed)
Pre visit review using our clinic review tool, if applicable. No additional management support is needed unless otherwise documented below in the visit note. 

## 2016-06-07 ENCOUNTER — Telehealth: Payer: Self-pay | Admitting: *Deleted

## 2016-06-07 ENCOUNTER — Other Ambulatory Visit: Payer: Self-pay | Admitting: Family Medicine

## 2016-06-07 DIAGNOSIS — R779 Abnormality of plasma protein, unspecified: Secondary | ICD-10-CM

## 2016-06-07 DIAGNOSIS — E8809 Other disorders of plasma-protein metabolism, not elsewhere classified: Secondary | ICD-10-CM

## 2016-06-07 NOTE — Telephone Encounter (Signed)
Patient's chest x-ray did not reveal any abnormalities. This was done in the emergency room. Thanks.

## 2016-06-07 NOTE — Telephone Encounter (Signed)
Patient is requesting Xray results, Please advise, this was not ordered by you. thanks

## 2016-06-07 NOTE — Telephone Encounter (Signed)
Patient requested lab/Xray results Pt Contact: [phone]

## 2016-06-07 NOTE — Telephone Encounter (Signed)
Patient advised of below and verbalized an understanding  

## 2016-06-07 NOTE — Telephone Encounter (Signed)
Can you call patient with results below thanks.

## 2016-06-12 ENCOUNTER — Encounter: Payer: Self-pay | Admitting: Family Medicine

## 2016-07-03 ENCOUNTER — Other Ambulatory Visit (INDEPENDENT_AMBULATORY_CARE_PROVIDER_SITE_OTHER): Payer: 59

## 2016-07-03 DIAGNOSIS — E8809 Other disorders of plasma-protein metabolism, not elsewhere classified: Secondary | ICD-10-CM | POA: Diagnosis not present

## 2016-07-03 DIAGNOSIS — R779 Abnormality of plasma protein, unspecified: Secondary | ICD-10-CM

## 2016-07-03 LAB — COMPREHENSIVE METABOLIC PANEL
ALT: 15 U/L (ref 0–53)
AST: 19 U/L (ref 0–37)
Albumin: 4.3 g/dL (ref 3.5–5.2)
Alkaline Phosphatase: 35 U/L — ABNORMAL LOW (ref 39–117)
BUN: 15 mg/dL (ref 6–23)
CHLORIDE: 103 meq/L (ref 96–112)
CO2: 29 mEq/L (ref 19–32)
Calcium: 9.8 mg/dL (ref 8.4–10.5)
Creatinine, Ser: 1.05 mg/dL (ref 0.40–1.50)
GFR: 105.28 mL/min (ref >60.00)
GLUCOSE: 98 mg/dL (ref 70–99)
Potassium: 4.1 mEq/L (ref 3.5–5.1)
SODIUM: 138 meq/L (ref 135–145)
Total Bilirubin: 0.6 mg/dL (ref 0.2–1.2)
Total Protein: 7.8 g/dL (ref 6.0–8.3)

## 2016-07-19 ENCOUNTER — Ambulatory Visit: Payer: 59 | Admitting: Family Medicine

## 2016-10-26 ENCOUNTER — Encounter: Payer: Self-pay | Admitting: Family Medicine

## 2016-10-27 NOTE — Telephone Encounter (Signed)
Patient notified

## 2016-10-27 NOTE — Telephone Encounter (Signed)
error 

## 2016-10-30 ENCOUNTER — Encounter: Payer: Self-pay | Admitting: Family Medicine

## 2016-10-30 ENCOUNTER — Emergency Department
Admission: EM | Admit: 2016-10-30 | Discharge: 2016-10-30 | Disposition: A | Discharge status: Home / Self Care | Payer: 59 | Attending: Emergency Medicine | Admitting: Emergency Medicine

## 2016-10-30 ENCOUNTER — Encounter: Payer: Self-pay | Admitting: Emergency Medicine

## 2016-10-30 ENCOUNTER — Emergency Department: Payer: 59

## 2016-10-30 DIAGNOSIS — Z79899 Other long term (current) drug therapy: Secondary | ICD-10-CM | POA: Insufficient documentation

## 2016-10-30 DIAGNOSIS — K219 Gastro-esophageal reflux disease without esophagitis: Secondary | ICD-10-CM

## 2016-10-30 DIAGNOSIS — R1013 Epigastric pain: Secondary | ICD-10-CM | POA: Insufficient documentation

## 2016-10-30 DIAGNOSIS — R1310 Dysphagia, unspecified: Secondary | ICD-10-CM | POA: Diagnosis not present

## 2016-10-30 HISTORY — DX: Gastro-esophageal reflux disease without esophagitis: K21.9

## 2016-10-30 LAB — COMPREHENSIVE METABOLIC PANEL
ALBUMIN: 4.8 g/dL (ref 3.5–5.0)
ALT: 20 U/L (ref 17–63)
AST: 38 U/L (ref 15–41)
Alkaline Phosphatase: 39 U/L (ref 38–126)
Anion gap: 8 (ref 5–15)
BUN: 17 mg/dL (ref 6–20)
CO2: 26 mmol/L (ref 22–32)
Calcium: 9.6 mg/dL (ref 8.9–10.3)
Chloride: 102 mmol/L (ref 101–111)
Creatinine, Ser: 1.27 mg/dL — ABNORMAL HIGH (ref 0.61–1.24)
GFR calc non Af Amer: >60 mL/min (ref >60)
GLUCOSE: 96 mg/dL (ref 65–99)
POTASSIUM: 3.9 mmol/L (ref 3.5–5.1)
SODIUM: 136 mmol/L (ref 135–145)
TOTAL PROTEIN: 8.3 g/dL — AB (ref 6.5–8.1)
Total Bilirubin: 0.7 mg/dL (ref 0.3–1.2)

## 2016-10-30 LAB — CBC
HEMATOCRIT: 49.6 % (ref 40.0–52.0)
Hemoglobin: 16.8 g/dL (ref 13.0–18.0)
MCH: 30.5 pg (ref 26.0–34.0)
MCHC: 33.9 g/dL (ref 32.0–36.0)
MCV: 90 fL (ref 80.0–100.0)
Platelets: 300 10*3/uL (ref 150–440)
RBC: 5.52 MIL/uL (ref 4.40–5.90)
RDW: 12.9 % (ref 11.5–14.5)
WBC: 9.7 10*3/uL (ref 3.8–10.6)

## 2016-10-30 LAB — LIPASE, BLOOD: LIPASE: 24 U/L (ref 11–51)

## 2016-10-30 LAB — TROPONIN I

## 2016-10-30 MED ORDER — GI COCKTAIL ~~LOC~~
30.0000 mL | Freq: Once | ORAL | Status: AC
  Administered 2016-10-30: 30 mL via ORAL
  Filled 2016-10-30: qty 30

## 2016-10-30 MED ORDER — PANTOPRAZOLE SODIUM 40 MG PO TBEC: 40.0000 mg | DELAYED_RELEASE_TABLET | Freq: Every day | ORAL | 0 refills | Status: DC

## 2016-10-30 NOTE — Discharge Instructions (Signed)
1. Start Protonix 40 mg daily. 2. Avoid spicy, greasy foods and alcohol. 3. Return to the ER for worsening symptoms, persistent vomiting, difficulty breathing or other concerns.

## 2016-10-30 NOTE — ED Notes (Signed)

## 2016-10-30 NOTE — ED Triage Notes (Signed)
Pt c/o intermittent burning sensation to epigastric area that radiates up into the center of his chest; symptoms started 6 days ago; pt taking Pepcid since Friday with minimal relief; says sometimes when he eats it feels like food is still stuck in his throat; pt rates pain 4/10; nausea earlier in the week, none now; denies shortness of breath; pt in no acute distress

## 2016-10-30 NOTE — Telephone Encounter (Signed)
Called patient answered all questions. He did not have any further questions. Just wanted to know why one of the comments on BMP was on it. Informed it is on all BMP and did not have anything to do with his results.

## 2016-10-30 NOTE — ED Provider Notes (Signed)
[facility] Emergency Department Provider Note   ____________________________________________   First MD Initiated Contact with Patient 10/30/16 0148     (approximate)  I have reviewed the triage vital signs and the nursing notes.   HISTORY  Chief Complaint Abdominal Pain    HPI John Snow is a 31 y.o. male who presents to the ED from home with a chief complaint of abdominal pain and burning. Patient has a history of GERD, does not take medications, who reports a 6 day history of burning sensation to his epigastrium. Began taking Pepcid 2 days ago with minimal relief. Went out last night and states he could not eat more than 6 chicken wings secondary to burning and discomfort.Notes sometimes when he eats it feels like food is stuck in his throat but he does not vomit and he is able to tolerate liquids and solids. Reports nausea earlier in the week, none currently. Denies associated fever, chills, chest pain, shortness of breath, vomiting, diarrhea Denies recent travel or trauma. Nothing makes his symptoms better or worse.   Past Medical History:  Diagnosis Date  . GERD (gastroesophageal reflux disease)   . Heart attack [facility]) 2007    Patient Active Problem List   Diagnosis Date Noted  . Influenza B 06/06/2016  . Pharyngitis 06/01/2016  . Allergic rhinitis 12/27/2015  . Shift work sleep disorder 12/27/2015  . Family history of prostate cancer in father 12/27/2015  . Palpitations 07/26/2015  . History of MI (myocardial infarction) 05/28/2015  . Obesity 05/28/2015    History reviewed. No pertinent surgical history.  Prior to Admission medications   Medication Sig Start Date End Date Taking? Authorizing Provider  famotidine (PEPCID AC) 10 MG chewable tablet Chew 10 mg by mouth daily.   Yes [provider]  pantoprazole (PROTONIX) 40 MG tablet Take 1 tablet (40 mg total) by mouth daily. 10/30/16   Paulette Blanch, MD    Allergies Patient  has no known allergies.  Family History  Problem Relation Age of Onset  . Cancer Father 57       Not prostate cancer  . Hypertension Maternal Grandmother     Social History Social History  Substance Use Topics  . Smoking status: Never Smoker  . Smokeless tobacco: Never Used  . Alcohol use 0.0 oz/week     Comment: occasional    Review of Systems  Constitutional: No fever/chills. Eyes: No visual changes. ENT: No sore throat. Cardiovascular: Denies chest pain. Respiratory: Denies shortness of breath. Gastrointestinal: Positive for abdominal pain and burning.  Positive for nausea, no vomiting.  No diarrhea.  No constipation. Genitourinary: Negative for dysuria. Musculoskeletal: Negative for back pain. Skin: Negative for rash. Neurological: Negative for headaches, focal weakness or numbness.   ____________________________________________   PHYSICAL EXAM:  VITAL SIGNS: ED Triage Vitals  Enc Vitals Group     BP 10/30/16 0030 127/73     Pulse Rate 10/30/16 0030 77     Resp 10/30/16 0030 18     Temp 10/30/16 0030 98.8 F (37.1 C)     Temp Source 10/30/16 0030 Oral     SpO2 10/30/16 0030 97 %     Weight 10/30/16 0031 245 lb (111.1 kg)     Height 10/30/16 0031 5\' 10"  (1.778 m)     Head Circumference --      Peak Flow --      Pain Score 10/30/16 0030 4     Pain Loc --  Pain Edu? --      Excl. in [place]? --     Constitutional: Alert and oriented. Well appearing and in no acute distress. Eyes: Conjunctivae are normal. PERRL. EOMI. Head: Atraumatic. Nose: No congestion/rhinnorhea. Mouth/Throat: Mucous membranes are moist.  Oropharynx non-erythematous. Neck: No stridor.   Cardiovascular: Normal rate, regular rhythm. Grossly normal heart sounds.  Good peripheral circulation. Respiratory: Normal respiratory effort.  No retractions. Lungs CTAB. Gastrointestinal: Soft and minimally tender to palpation epigastrium without rebound or guarding. No distention. No abdominal  bruits. No CVA tenderness. Musculoskeletal: No lower extremity tenderness nor edema.  No joint effusions. Neurologic:  Normal speech and language. No gross focal neurologic deficits are appreciated. No gait instability. Skin:  Skin is warm, dry and intact. No rash noted. Psychiatric: Mood and affect are normal. Speech and behavior are normal.  ____________________________________________   LABS (all labs ordered are listed, but only abnormal results are displayed)  Labs Reviewed  COMPREHENSIVE METABOLIC PANEL - Abnormal; Notable for the following:       Result Value   Creatinine, Ser 1.27 (*)    Total Protein 8.3 (*)    All other components within normal limits  LIPASE, BLOOD  CBC  TROPONIN I   ____________________________________________  EKG  ED ECG REPORT I, SUNG,JADE J, the attending physician, personally viewed and interpreted this ECG.   Date: 10/30/2016  EKG Time: 0030  Rate: 86  Rhythm: normal EKG, normal sinus rhythm  Axis: Normal  Intervals:none  ST&T Change: nonspecific  ____________________________________________  RADIOLOGY  Dg Chest 2 View  Result Date: 10/30/2016 CLINICAL DATA:  Epigastric pain radiating into the chest. EXAM: CHEST  2 VIEW COMPARISON:  Radiograph 06/03/2016 FINDINGS: The cardiomediastinal contours are normal. The lungs are clear. Pulmonary vasculature is normal. No consolidation, pleural effusion, or pneumothorax. No acute osseous abnormalities are seen. IMPRESSION: No acute pulmonary process. Electronically Signed   By: Jeb Levering M.D.   On: 10/30/2016 00:57    ____________________________________________   PROCEDURES  Procedure(s) performed: None  Procedures  Critical Care performed: No  ____________________________________________   INITIAL IMPRESSION / ASSESSMENT AND PLAN / ED COURSE  Pertinent labs & imaging results that were available during my care of the patient were reviewed by me and considered in my medical  decision making (see chart for details).  31 year old male with GERD who presents with epigastric pain and burning for the past 6 days, exacerbated by chicken wings. Also notes sensation of esophageal food bolus at times but currently able to eat and drink without difficulty. Laboratory results unremarkable. Will try GI cocktail.  Clinical Course as of Oct 30 345  Mon Oct 30, 2016  4259 Patient improved. Will place on Protonix and referred to GI for outpatient follow-up. Strict return precautions given. Patient verbalizes understanding and agrees with plan of care.  [JS]    Clinical Course User Index [JS] Paulette Blanch, MD     ____________________________________________   FINAL CLINICAL IMPRESSION(S) / ED DIAGNOSES  Final diagnoses:  Epigastric pain  Gastroesophageal reflux disease, esophagitis presence not specified  Dysphagia, unspecified type      NEW MEDICATIONS STARTED DURING THIS VISIT:  New Prescriptions   PANTOPRAZOLE (PROTONIX) 40 MG TABLET    Take 1 tablet (40 mg total) by mouth daily.     Note:  This document was prepared using Dragon voice recognition software and may include unintentional dictation errors.    Paulette Blanch, MD 10/30/16 [phone]

## 2016-11-01 ENCOUNTER — Other Ambulatory Visit: Payer: Self-pay | Admitting: Student

## 2016-11-01 DIAGNOSIS — K30 Functional dyspepsia: Secondary | ICD-10-CM

## 2016-11-01 DIAGNOSIS — R1310 Dysphagia, unspecified: Secondary | ICD-10-CM

## 2016-11-01 DIAGNOSIS — R1013 Epigastric pain: Secondary | ICD-10-CM

## 2016-11-10 ENCOUNTER — Ambulatory Visit
Admission: RE | Admit: 2016-11-10 | Discharge: 2016-11-10 | Disposition: A | Discharge status: Home / Self Care | Payer: 59 | Source: Ambulatory Visit | Attending: Student | Admitting: Student

## 2016-11-10 DIAGNOSIS — R935 Abnormal findings on diagnostic imaging of other abdominal regions, including retroperitoneum: Secondary | ICD-10-CM | POA: Diagnosis not present

## 2016-11-10 DIAGNOSIS — K449 Diaphragmatic hernia without obstruction or gangrene: Secondary | ICD-10-CM | POA: Diagnosis not present

## 2016-11-10 DIAGNOSIS — K30 Functional dyspepsia: Secondary | ICD-10-CM | POA: Diagnosis present

## 2016-11-10 DIAGNOSIS — R1310 Dysphagia, unspecified: Secondary | ICD-10-CM

## 2016-11-10 DIAGNOSIS — K219 Gastro-esophageal reflux disease without esophagitis: Secondary | ICD-10-CM | POA: Insufficient documentation

## 2016-11-10 DIAGNOSIS — R1013 Epigastric pain: Secondary | ICD-10-CM

## 2016-11-11 ENCOUNTER — Encounter: Payer: Self-pay | Admitting: Family Medicine

## 2016-12-15 ENCOUNTER — Encounter: Payer: Self-pay | Admitting: Family Medicine

## 2016-12-15 ENCOUNTER — Ambulatory Visit (INDEPENDENT_AMBULATORY_CARE_PROVIDER_SITE_OTHER): Payer: 59 | Admitting: Family Medicine

## 2016-12-15 VITALS — BP 122/80 | HR 83 | Temp 98.4°F | Wt 232.0 lb

## 2016-12-15 DIAGNOSIS — G4710 Hypersomnia, unspecified: Secondary | ICD-10-CM | POA: Diagnosis not present

## 2016-12-15 DIAGNOSIS — R202 Paresthesia of skin: Secondary | ICD-10-CM

## 2016-12-15 DIAGNOSIS — Z8042 Family history of malignant neoplasm of prostate: Secondary | ICD-10-CM

## 2016-12-15 DIAGNOSIS — E6609 Other obesity due to excess calories: Secondary | ICD-10-CM

## 2016-12-15 NOTE — Patient Instructions (Signed)
Nice to see you. Please keep up the diet changes. Please follow up with GI. We'll get lab work today and contact you with the results. You can continue the nasal strips and saline for your nose. We will get you set up for sleep study.

## 2016-12-15 NOTE — Assessment & Plan Note (Addendum)
Odd tingling sensation. None in the last week. Could be positionally related though with perioral tingling in bilateral symptoms we'll check a calcium. B12 as well. TSH as well.

## 2016-12-15 NOTE — Assessment & Plan Note (Addendum)
Check PSA. ?

## 2016-12-15 NOTE — Assessment & Plan Note (Signed)
Possible sleep apnea. Hypersomnia and apneic episodes previously. Check sleep study.

## 2016-12-15 NOTE — Assessment & Plan Note (Signed)
Weight is down. Continue dietary changes.

## 2016-12-15 NOTE — Progress Notes (Signed)
  [name], MD Phone: [phone]  John Snow is a 31 y.o. male who presents today for follow-up.  GERD: Has been following with GI. He was evaluated in the emergency room and had some epigastric burning sensation. He was placed on Protonix and Pepcid. Follow up with GI and placed him on Carafate. Is mostly better. They suggested doing an EGD though he deferred. He follows up with him later this month.  Obesity: He is made quite a number of diet changes. He is eating only lean meats. He's cut out fried foods. Also broccoli. No soda or sweet tea. No alcohol. Not really exercising though he is on his feet all day at the gym. He is down about 23 pounds over the last 6 months.  Patient notes on several occasions he's woken himself up gasping for breath. His wife notes he did have apneic episodes previously though she hasn't noticed them since he's lost weight. He started using nasal strips and saline and this is helped his symptoms significantly. He does not snore. Does have hypersomnia though he does work third shift.  He notes on a couple of occasions a week or so ago he felt tingling sensation in his bilateral arms and legs and around his mouth. Only occurred when he was sitting down. If he stood up or change positions it went away.  PMH: nonsmoker.   ROS see history of present illness  Objective  Physical Exam Vitals:   12/15/16 1502  BP: 122/80  Pulse: 83  Temp: 98.4 F (36.9 C)  SpO2: 98%    BP Readings from Last 3 Encounters:  12/15/16 122/80  10/30/16 131/77  06/06/16 100/60   Wt Readings from Last 3 Encounters:  12/15/16 232 lb (105.2 kg)  10/30/16 245 lb (111.1 kg)  06/06/16 255 lb (115.7 kg)    Physical Exam  Constitutional: No distress.  Cardiovascular: Normal rate, regular rhythm and normal heart sounds.   Pulmonary/Chest: Effort normal and breath sounds normal.  Abdominal: Soft. Bowel sounds are normal. He exhibits no distension. There is no  tenderness. There is no rebound and no guarding.  Musculoskeletal: He exhibits no edema.  Neurological: He is alert. Gait normal.  Skin: Skin is warm and dry. He is not diaphoretic.     Assessment/Plan: Please see individual problem list.  Family history of prostate cancer in father Check PSA.  Obesity Weight is down. Continue dietary changes.  Tingling Odd tingling sensation. None in the last week. Could be positionally related though with perioral tingling in bilateral symptoms we'll check a calcium. B12 as well. TSH as well.  Hypersomnia Possible sleep apnea. Hypersomnia and apneic episodes previously. Check sleep study.   Orders Placed This Encounter  Procedures  . Comp Met (CMET)  . TSH  . B12  . PSA  . Split night study    Standing Status:   Future    Standing Expiration Date:   12/15/2017    Order Specific Question:   Where should this test be performed:    Answer:   [facility]    [name], MD [place]

## 2016-12-16 LAB — COMPREHENSIVE METABOLIC PANEL
AG RATIO: 1.5 (calc) (ref 1.0–2.5)
ALKALINE PHOSPHATASE (APISO): 34 U/L — AB (ref 40–115)
ALT: 8 U/L — ABNORMAL LOW (ref 9–46)
AST: 14 U/L (ref 10–40)
Albumin: 4.6 g/dL (ref 3.6–5.1)
BILIRUBIN TOTAL: 0.9 mg/dL (ref 0.2–1.2)
BUN: 12 mg/dL (ref 7–25)
CALCIUM: 9.8 mg/dL (ref 8.6–10.3)
CHLORIDE: 99 mmol/L (ref 98–110)
CO2: 23 mmol/L (ref 20–32)
Creat: 1.14 mg/dL (ref 0.60–1.35)
GLOBULIN: 3.1 g/dL (ref 1.9–3.7)
Glucose, Bld: 97 mg/dL (ref 65–99)
Potassium: 4.2 mmol/L (ref 3.5–5.3)
Sodium: 138 mmol/L (ref 135–146)
Total Protein: 7.7 g/dL (ref 6.1–8.1)

## 2016-12-16 LAB — PSA: PSA: 0.5 ng/mL (ref <=4.0)

## 2016-12-16 LAB — TSH: TSH: 1.09 m[IU]/L (ref 0.40–4.50)

## 2016-12-16 LAB — VITAMIN B12: VITAMIN B 12: 804 pg/mL (ref 200–1100)

## 2016-12-18 ENCOUNTER — Other Ambulatory Visit: Payer: Self-pay | Admitting: Family Medicine

## 2016-12-18 ENCOUNTER — Telehealth: Payer: Self-pay | Admitting: Family Medicine

## 2016-12-18 ENCOUNTER — Encounter: Payer: Self-pay | Admitting: Family Medicine

## 2016-12-18 DIAGNOSIS — R748 Abnormal levels of other serum enzymes: Secondary | ICD-10-CM

## 2016-12-18 NOTE — Telephone Encounter (Signed)
Pt called returning a phone call  

## 2016-12-18 NOTE — Telephone Encounter (Signed)
See result note.  

## 2017-01-19 ENCOUNTER — Other Ambulatory Visit: Payer: 59

## 2017-01-19 NOTE — Addendum Note (Signed)
Addended by: Arby Barrette on: 01/19/2017 08:52 AM   Modules accepted: Orders

## 2017-04-26 ENCOUNTER — Encounter: Payer: Self-pay | Admitting: Family Medicine

## 2017-04-26 ENCOUNTER — Ambulatory Visit: Payer: Self-pay

## 2017-04-26 ENCOUNTER — Ambulatory Visit: Payer: 59 | Admitting: Family Medicine

## 2017-04-26 ENCOUNTER — Other Ambulatory Visit: Payer: Self-pay

## 2017-04-26 ENCOUNTER — Ambulatory Visit (INDEPENDENT_AMBULATORY_CARE_PROVIDER_SITE_OTHER): Payer: Self-pay | Admitting: Family Medicine

## 2017-04-26 ENCOUNTER — Telehealth: Payer: Self-pay | Admitting: Family Medicine

## 2017-04-26 VITALS — BP 126/78 | HR 78 | Temp 97.7°F | Wt 218.0 lb

## 2017-04-26 DIAGNOSIS — R0789 Other chest pain: Secondary | ICD-10-CM

## 2017-04-26 DIAGNOSIS — R634 Abnormal weight loss: Secondary | ICD-10-CM

## 2017-04-26 DIAGNOSIS — R002 Palpitations: Secondary | ICD-10-CM

## 2017-04-26 DIAGNOSIS — K219 Gastro-esophageal reflux disease without esophagitis: Secondary | ICD-10-CM | POA: Insufficient documentation

## 2017-04-26 LAB — POCT URINALYSIS DIPSTICK
Bilirubin, UA: NEGATIVE
GLUCOSE UA: NEGATIVE
Ketones, UA: 15
LEUKOCYTES UA: NEGATIVE
NITRITE UA: NEGATIVE
PROTEIN UA: NEGATIVE
SPEC GRAV UA: 1.01 (ref 1.010–1.025)
Urobilinogen, UA: 0.2 E.U./dL
pH, UA: 5 (ref 5.0–8.0)

## 2017-04-26 LAB — CBC WITH DIFFERENTIAL/PLATELET
BASOS ABS: 0 10*3/uL (ref 0.0–0.1)
Basophils Relative: 0.6 % (ref 0.0–3.0)
EOS PCT: 0.9 % (ref 0.0–5.0)
Eosinophils Absolute: 0.1 10*3/uL (ref 0.0–0.7)
HEMATOCRIT: 49.1 % (ref 39.0–52.0)
Hemoglobin: 16.3 g/dL (ref 13.0–17.0)
LYMPHS ABS: 3 10*3/uL (ref 0.7–4.0)
LYMPHS PCT: 38.5 % (ref 12.0–46.0)
MCHC: 33.1 g/dL (ref 30.0–36.0)
MCV: 93.4 fl (ref 78.0–100.0)
MONOS PCT: 9.1 % (ref 3.0–12.0)
Monocytes Absolute: 0.7 10*3/uL (ref 0.1–1.0)
NEUTROS ABS: 4 10*3/uL (ref 1.4–7.7)
NEUTROS PCT: 50.9 % (ref 43.0–77.0)
PLATELETS: 285 10*3/uL (ref 150.0–400.0)
RBC: 5.26 Mil/uL (ref 4.22–5.81)
RDW: 13 % (ref 11.5–15.5)
WBC: 7.9 10*3/uL (ref 4.0–10.5)

## 2017-04-26 LAB — SEDIMENTATION RATE: SED RATE: 17 mm/h — AB (ref 0–15)

## 2017-04-26 LAB — COMPREHENSIVE METABOLIC PANEL
ALT: 9 U/L (ref 0–53)
AST: 15 U/L (ref 0–37)
Albumin: 4.6 g/dL (ref 3.5–5.2)
Alkaline Phosphatase: 37 U/L — ABNORMAL LOW (ref 39–117)
BUN: 11 mg/dL (ref 6–23)
CALCIUM: 10.1 mg/dL (ref 8.4–10.5)
CO2: 29 meq/L (ref 19–32)
Chloride: 99 mEq/L (ref 96–112)
Creatinine, Ser: 1.07 mg/dL (ref 0.40–1.50)
GFR: 102.49 mL/min (ref >60.00)
GLUCOSE: 99 mg/dL (ref 70–99)
POTASSIUM: 3.4 meq/L — AB (ref 3.5–5.1)
Sodium: 136 mEq/L (ref 135–145)
Total Bilirubin: 0.8 mg/dL (ref 0.2–1.2)
Total Protein: 8.1 g/dL (ref 6.0–8.3)

## 2017-04-26 LAB — HEMOGLOBIN A1C: Hgb A1c MFr Bld: 5.5 % (ref 4.6–6.5)

## 2017-04-26 LAB — TSH: TSH: 2.65 u[IU]/mL (ref 0.35–4.50)

## 2017-04-26 LAB — TROPONIN I: TNIDX: 0 ug/L (ref 0.00–0.06)

## 2017-04-26 NOTE — Telephone Encounter (Signed)
  Pt. States last night had "palpitations that would last one second. Had heart burn as well." No chest pain. No palpitations now. Appointment for today. States history of palpitations in the past. States cardiologist did not think he needed holter monitor. Reason for Disposition . History of heart disease  (i.e., heart attack, bypass surgery, angina, angioplasty, CHF) (Exception: brief heart beat symptoms that went away and now feels well)  Answer Assessment - Initial Assessment Questions 1. DESCRIPTION: "Please describe your heart rate or heart beat that you are having" (e.g., fast/slow, regular/irregular, skipped or extra beats, "palpitations")     Palpitations 2. ONSET: "When did it start?" (Minutes, hours or days)      Last night 3. DURATION: "How long does it last" (e.g., seconds, minutes, hours)     Lasted about a second 4. PATTERN "Does it come and go, or has it been constant since it started?"  "Does it get worse with exertion?"   "Are you feeling it now?"     Comes and goes 5. TAP: "Using your hand, can you tap out what you are feeling on a chair or table in front of you, so that I can hear?" (Note: not all patients can do this)       It is regular now 6. HEART RATE: "Can you tell me your heart rate?" "How many beats in 15 seconds?"  (Note: not all patients can do this)       Can't find pulse 7. RECURRENT SYMPTOM: "Have you ever had this before?" If so, ask: "When was the last time?" and "What happened that time?"      Yes - saw a cardiologist 8. CAUSE: "What do you think is causing the palpitations?"     Not sure 9. CARDIAC HISTORY: "Do you have any history of heart disease?" (e.g., heart attack, angina, bypass surgery, angioplasty, arrhythmia)      Heart attack 32 years of age 33. OTHER SYMPTOMS: "Do you have any other symptoms?" (e.g., dizziness, chest pain, sweating, difficulty breathing)       Chest burning "like heartburn" 11. PREGNANCY: "Is there any chance you are  pregnant?" "When was your last menstrual period?"       No  Protocols used: HEART RATE AND HEARTBEAT QUESTIONS-A-AH

## 2017-04-26 NOTE — Assessment & Plan Note (Signed)
Patient with episodes of palpitations which seem consistent with PVCs based on description.  EKG is reassuring.  I discussed checking some lab work and likely referring him back to cardiology.  I do not feel this is an ischemic issue though given his concern with possible history of prior MI we will check a troponin.  Given that it has been over 12 hours since he had chest burning symptoms I feel that if this is negative it would rule out any ischemic issues.  I discussed return precautions with the patient.

## 2017-04-26 NOTE — Telephone Encounter (Signed)
fyi

## 2017-04-26 NOTE — Telephone Encounter (Signed)
Please advise 

## 2017-04-26 NOTE — Patient Instructions (Signed)
Nice to see you. We will get lab work today. If you have chest pressure, shortness of breath, or any new or changing symptoms please seek medical attention immediately.

## 2017-04-26 NOTE — Assessment & Plan Note (Signed)
I suspect the burning sensation that he describes is related to reflux.  He was recently treated for this though has been off of treatment for about a month now.  We will check lab work to rule out cardiac cause though I find this unlikely.  We will likely start him back on Protonix.

## 2017-04-26 NOTE — Assessment & Plan Note (Signed)
I suspect this is most likely related to his prior dietary changes.  He has continued dietary changes though has not been exercising.  He was quite obese prior to changing his diet.  I did discuss this with him though the continued weight loss without much other change is a little concerning.  He has a benign exam.  We will start with checking lab work and then consider the next step.

## 2017-04-26 NOTE — Telephone Encounter (Signed)
Copied from [place] [id number]. Topic: Quick Communication - Lab Results >> Apr 26, 2017  4:28 PM Darl Householder, RMA wrote: Patient is requesting a callback concerning lab results

## 2017-04-26 NOTE — Progress Notes (Signed)
[name], MD Phone: [phone]  John Snow is a 32 y.o. male who presents today for same-day visit.  Patient notes starting yesterday he was having some palpitations and felt as though his heart was skipping a beat.  It will be one beat and then gone.  He noted no chest pain or shortness of breath with it.  He did note some epigastric burning sensation and reflux sensation in his chest as well.  Notes a little bit of cramping in his abdomen with this.  He had previously been treated for reflux with Protonix and Carafate though he discontinued those about a month ago at the advice of his GI office.  He noted no symptoms at that time.  He still notes an occasional skipped beat while here in the office though no chest discomfort.  He notes he has lost more weight.  He is still watching what he eats though has not been exercising.  He has not necessarily been trying to lose weight.  He notes no night sweats, itching, depression, anxiety, abdominal pain, early satiety, lumps or bumps, or fevers.  Is concerned given the progressive weight loss.  No smoking history.  Most recent PSA was normal and this given his father's history of prostate cancer.  Social History   Tobacco Use  Smoking Status Never Smoker  Smokeless Tobacco Never Used     ROS see history of present illness  Objective  Physical Exam Vitals:   04/26/17 1116  BP: 126/78  Pulse: 78  Temp: 97.7 F (36.5 C)  SpO2: 98%    BP Readings from Last 3 Encounters:  04/26/17 126/78  12/15/16 122/80  10/30/16 131/77   Wt Readings from Last 3 Encounters:  04/26/17 218 lb (98.9 kg)  12/15/16 232 lb (105.2 kg)  10/30/16 245 lb (111.1 kg)    Physical Exam  Constitutional: No distress.  Neck: Neck supple.  Cardiovascular: Normal rate, regular rhythm and normal heart sounds.  Pulmonary/Chest: Effort normal and breath sounds normal.  Abdominal: Soft. Bowel sounds are normal. He exhibits no distension. There is no  tenderness. There is no rebound and no guarding.  Musculoskeletal: He exhibits no edema.  Lymphadenopathy:       Head (right side): No submental and no submandibular adenopathy present.       Head (left side): No submental and no submandibular adenopathy present.    He has no cervical adenopathy.    He has no axillary adenopathy.       Right: No inguinal and no supraclavicular adenopathy present.       Left: No inguinal and no supraclavicular adenopathy present.  Neurological: He is alert. Gait normal.  Skin: Skin is warm and dry. He is not diaphoretic.   EKG: NSR, rate 83, negative precordial t-waves similar to prior  Assessment/Plan: Please see individual problem list.  Palpitations Patient with episodes of palpitations which seem consistent with PVCs based on description.  EKG is reassuring.  I discussed checking some lab work and likely referring him back to cardiology.  I do not feel this is an ischemic issue though given his concern with possible history of prior MI we will check a troponin.  Given that it has been over 12 hours since he had chest burning symptoms I feel that if this is negative it would rule out any ischemic issues.  I discussed return precautions with the patient.  GERD (gastroesophageal reflux disease) I suspect the burning sensation that he describes is related to reflux.  He was recently treated for this though has been off of treatment for about a month now.  We will check lab work to rule out cardiac cause though I find this unlikely.  We will likely start him back on Protonix.  Weight loss I suspect this is most likely related to his prior dietary changes.  He has continued dietary changes though has not been exercising.  He was quite obese prior to changing his diet.  I did discuss this with him though the continued weight loss without much other change is a little concerning.  He has a benign exam.  We will start with checking lab work and then consider the  next step.   Orders Placed This Encounter  Procedures  . Comp Met (CMET)  . CBC w/Diff  . HgB A1c  . TSH  . Sedimentation rate  . HIV antibody (with reflex)  . Troponin I  . POCT Urinalysis Dipstick  . EKG 12-Lead    No orders of the defined types were placed in this encounter.    [name], MD [place]

## 2017-04-27 LAB — HIV ANTIBODY (ROUTINE TESTING W REFLEX): HIV 1&2 Ab, 4th Generation: NONREACTIVE

## 2017-04-27 LAB — URINALYSIS, MICROSCOPIC ONLY

## 2017-04-27 NOTE — Telephone Encounter (Signed)
Dr Caryl Bis sent mychart message to patient today

## 2017-04-27 NOTE — Addendum Note (Signed)
Addended by: Leeanne Rio on: 04/27/2017 08:48 AM   Modules accepted: Orders

## 2017-04-30 ENCOUNTER — Encounter: Payer: Self-pay | Admitting: *Deleted

## 2017-04-30 ENCOUNTER — Encounter: Payer: Self-pay | Admitting: Family Medicine

## 2017-05-03 DIAGNOSIS — R1013 Epigastric pain: Secondary | ICD-10-CM | POA: Diagnosis not present

## 2017-05-03 DIAGNOSIS — K30 Functional dyspepsia: Secondary | ICD-10-CM | POA: Diagnosis not present

## 2017-05-29 ENCOUNTER — Encounter: Payer: Self-pay | Admitting: Family Medicine

## 2017-05-29 DIAGNOSIS — R634 Abnormal weight loss: Secondary | ICD-10-CM

## 2017-06-04 ENCOUNTER — Ambulatory Visit (INDEPENDENT_AMBULATORY_CARE_PROVIDER_SITE_OTHER): Payer: 59

## 2017-06-04 DIAGNOSIS — R634 Abnormal weight loss: Secondary | ICD-10-CM | POA: Diagnosis not present

## 2017-06-13 ENCOUNTER — Other Ambulatory Visit (INDEPENDENT_AMBULATORY_CARE_PROVIDER_SITE_OTHER): Payer: 59

## 2017-06-13 DIAGNOSIS — R634 Abnormal weight loss: Secondary | ICD-10-CM | POA: Diagnosis not present

## 2017-06-13 LAB — FECAL OCCULT BLOOD, IMMUNOCHEMICAL: Fecal Occult Bld: NEGATIVE

## 2017-06-20 IMAGING — CR DG CHEST 2V
2 series · 2 of 2 positions shown · non-contrast
Comparison: None.

CLINICAL DATA: Acute onset of fever and cough.  Initial encounter.

EXAM:
CHEST  2 VIEW

[chest pa]
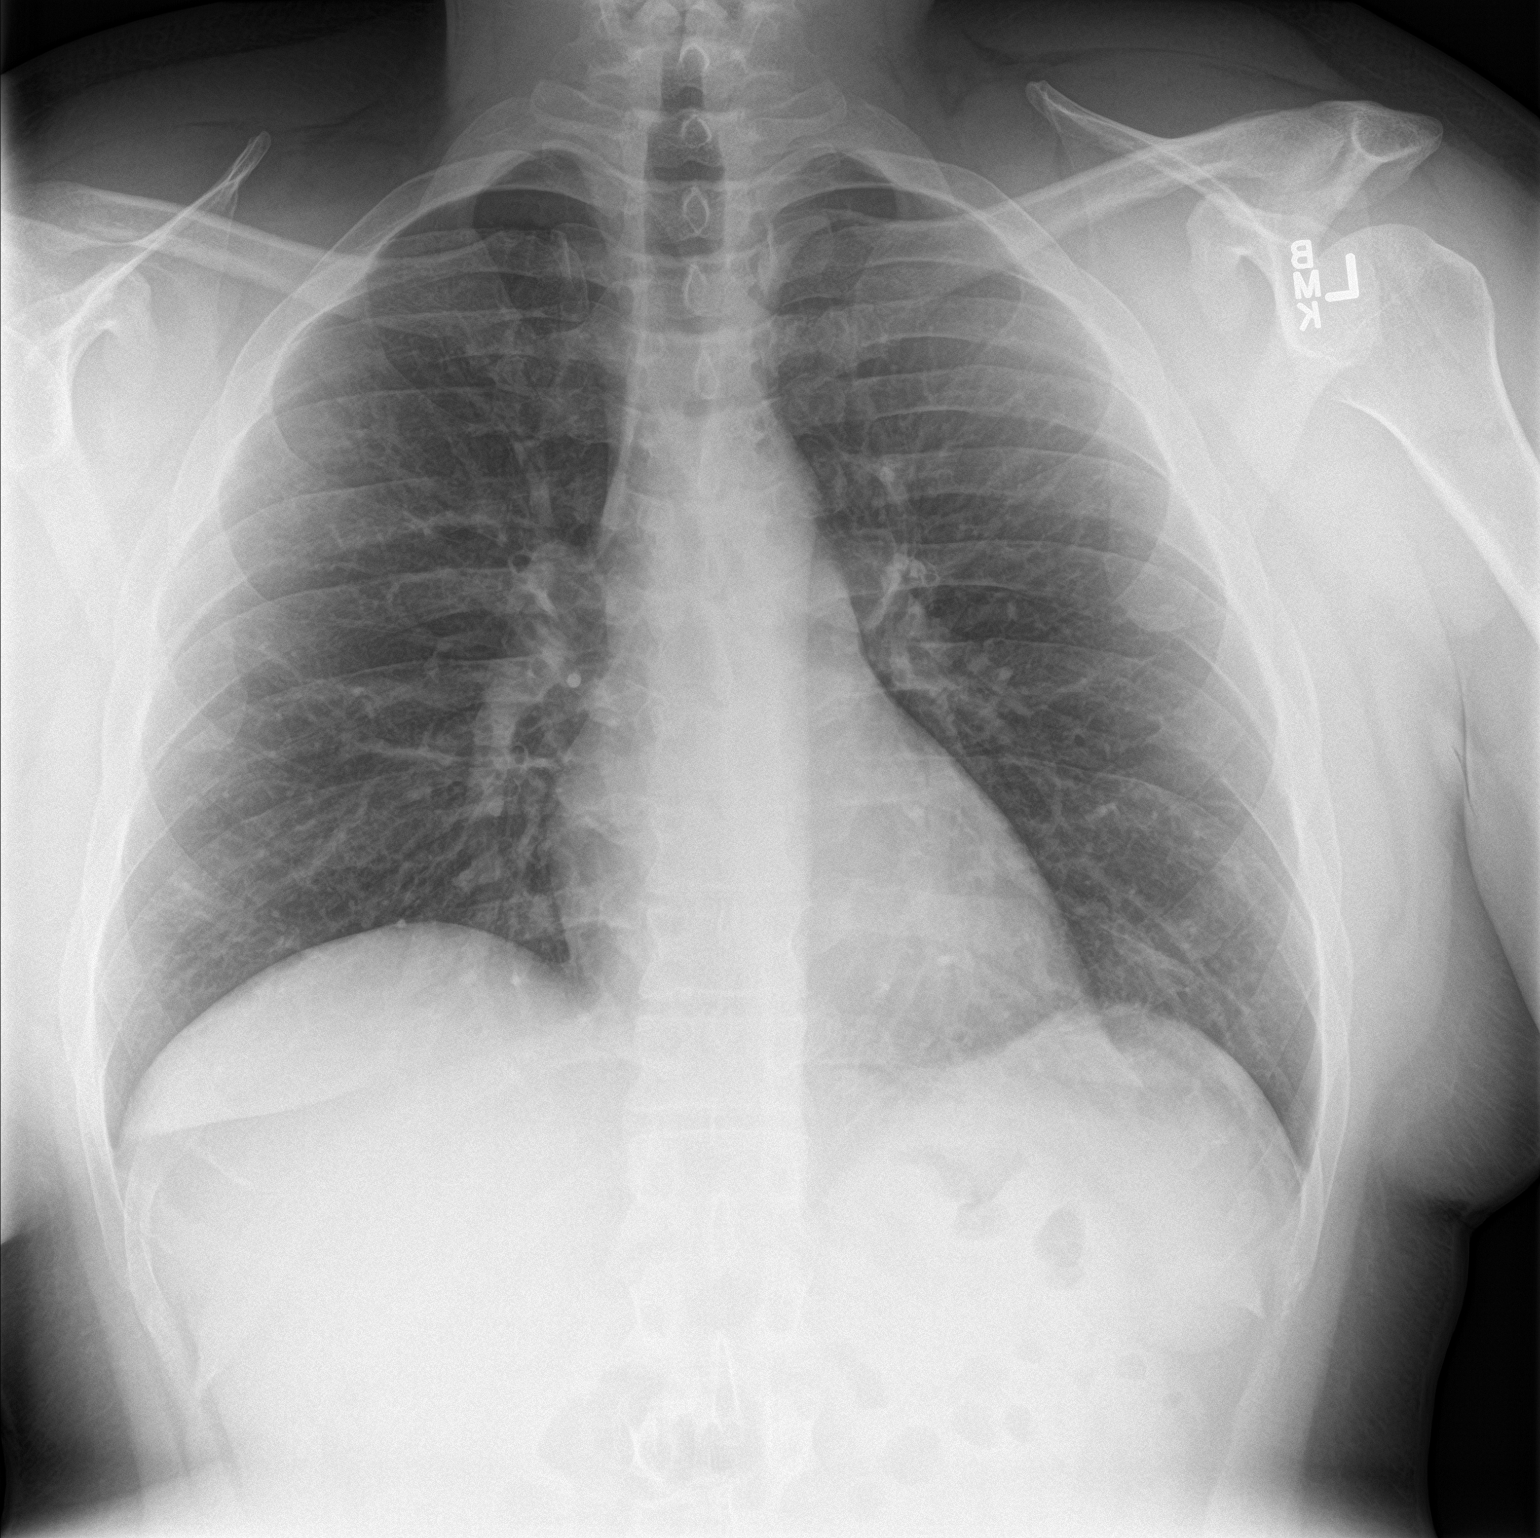

[chest lat]
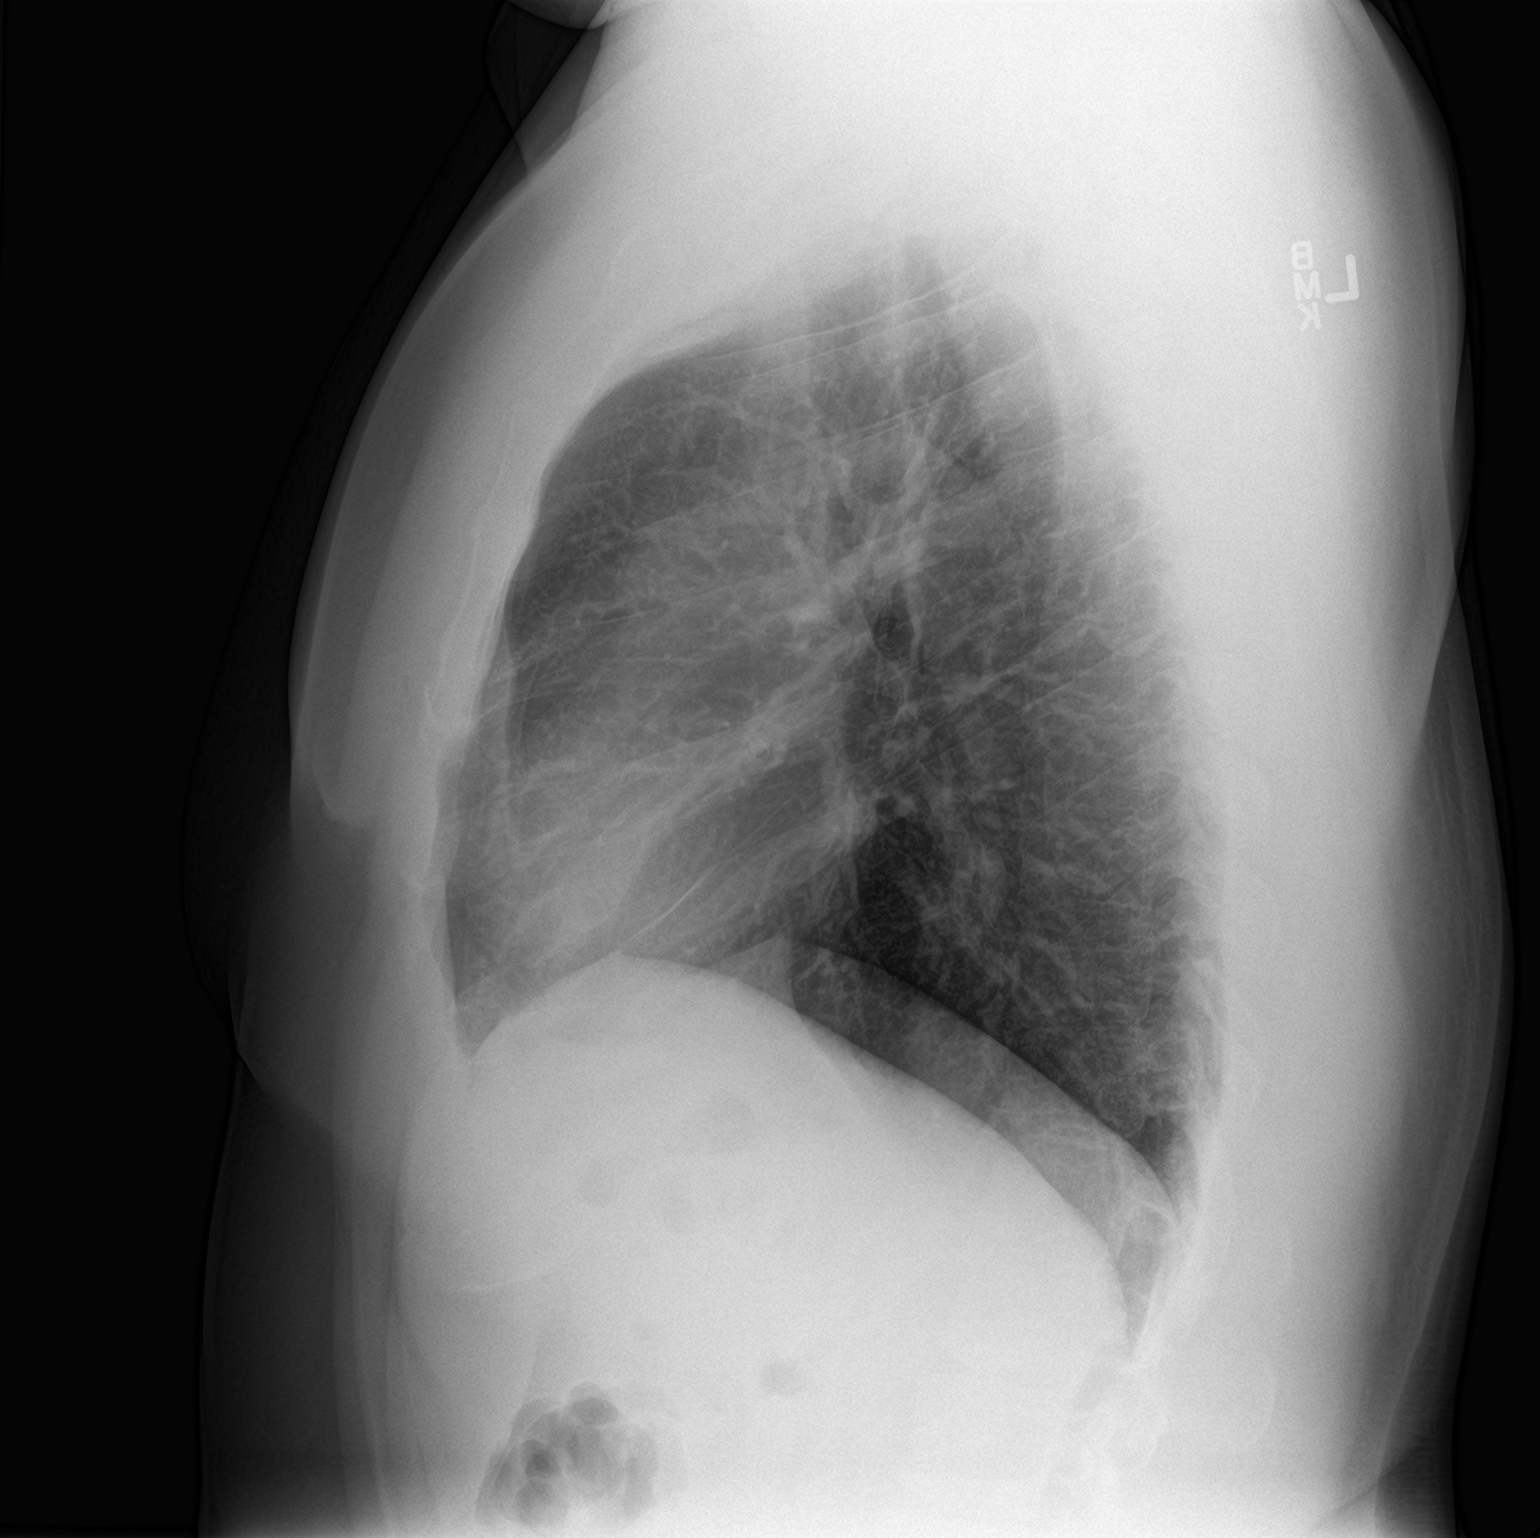

[2 of 2 positions shown; findings below may reference images not displayed]

FINDINGS: The lungs are well-aerated and clear. There is no evidence of focal
opacification, pleural effusion or pneumothorax.

The heart is normal in size; the mediastinal contour is within
normal limits. No acute osseous abnormalities are seen.
IMPRESSION: No acute cardiopulmonary process seen.

## 2017-06-29 ENCOUNTER — Ambulatory Visit: Payer: 59 | Admitting: Family Medicine

## 2017-06-29 ENCOUNTER — Encounter: Payer: Self-pay | Admitting: Family Medicine

## 2017-06-29 ENCOUNTER — Other Ambulatory Visit: Payer: Self-pay

## 2017-06-29 VITALS — BP 108/70 | HR 67 | Temp 98.0°F | Ht 70.0 in | Wt 206.2 lb

## 2017-06-29 DIAGNOSIS — B070 Plantar wart: Secondary | ICD-10-CM | POA: Diagnosis not present

## 2017-06-29 DIAGNOSIS — K219 Gastro-esophageal reflux disease without esophagitis: Secondary | ICD-10-CM

## 2017-06-29 DIAGNOSIS — L819 Disorder of pigmentation, unspecified: Secondary | ICD-10-CM

## 2017-06-29 DIAGNOSIS — R634 Abnormal weight loss: Secondary | ICD-10-CM

## 2017-06-29 NOTE — Progress Notes (Signed)
[name], MD Phone: [phone]  John Snow is a 32 y.o. male who presents today for f/u.  Weight loss: Patient has continued to lose some weight.  He notes today his diet has drastically changed over the last year or 2.  He is eating 2 meals a day.  First meal consists of oatmeal and boiled eggs.  The next meal was chicken and pasta with some vegetables.  He is not eating any fried foods anymore.  He has cut out juice and sweet tea.  Mostly eating water.  He notes no depression or anxiety.  He has had lab evaluation and chest x-ray as well as FOBT studies.  Overall work-up is been unremarkable thus far.  He has seen GI and was treated for gastritis and duodenitis though he discontinued the Protonix and Carafate as his symptoms improved.  No early satiety.  No smoking history.  No night sweats or itching.  Occasional epigastric burning sensation.  He does note GI had them keep a calorie intake list and he was at least 200 to 300 cal lower than he needed to be for maintenance.  He notes no depression.  He does have some anxiety surrounding his weight loss.  Patient additionally notes a knot on the left lateral plantar surface of his foot in the midfoot.  This hurts when he steps on it.  Social History   Tobacco Use  Smoking Status Never Smoker  Smokeless Tobacco Never Used     ROS see history of present illness  Objective  Physical Exam Vitals:   06/29/17 1608  BP: 108/70  Pulse: 67  Temp: 98 F (36.7 C)  SpO2: 95%    BP Readings from Last 3 Encounters:  06/29/17 108/70  04/26/17 126/78  12/15/16 122/80   Wt Readings from Last 3 Encounters:  06/29/17 206 lb 3.2 oz (93.5 kg)  04/26/17 218 lb (98.9 kg)  12/15/16 232 lb (105.2 kg)    Physical Exam  Constitutional: No distress.  HENT:  Head: Normocephalic and atraumatic.  Mouth/Throat: Oropharynx is clear and moist. No oropharyngeal exudate.  Neck: Neck supple.  Cardiovascular: Normal rate, regular rhythm and  normal heart sounds.  Pulmonary/Chest: Effort normal and breath sounds normal.  Abdominal: Soft. Bowel sounds are normal. He exhibits no distension and no mass. There is no tenderness. There is no rebound and no guarding.  Musculoskeletal: He exhibits no edema.       Feet:  Lymphadenopathy:       Head (right side): No submental and no submandibular adenopathy present.       Head (left side): No submental and no submandibular adenopathy present.    He has no cervical adenopathy.    He has no axillary adenopathy.       Right: No inguinal and no supraclavicular adenopathy present.       Left: No inguinal and no supraclavicular adenopathy present.  Neurological: He is alert.  Skin: Skin is warm and dry. He is not diaphoretic.  On the dorsum of his left second toe there is a very small hyperpigmented lesion that is not raised, it is uniform in color  Psychiatric: He has a normal mood and affect.     Assessment/Plan: Please see individual problem list.  Weight loss Thus far patient has had unremarkable work-up for his weight loss.  He has drastically changed his diet and I feel like his weight loss is most likely related to dietary changes and inadequate calorie intake.  I discussed having  him try to get a third meal per day.  He will continue to eat healthfully.  We will refer him to dermatology for evaluation of the hyperpigmented lesion on the dorsum of his left second toe.  I discussed that this could be a benign lesion though there is always the possibility that a lesion like this could be cancerous.  GERD (gastroesophageal reflux disease) Patient with rare symptoms now.  Discussed potentially treating with a PPI or Zantac though he deferred this.  He will watch for dietary triggers.  Hyperpigmented skin lesion Indeterminate cause.  Refer to dermatology for evaluation.  Plantar wart of left foot Discussed salicylic acid pads.  Advised this could take several months to fully work.  If  not beneficial we could do cryotherapy.   Orders Placed This Encounter  Procedures  . Ambulatory referral to Dermatology    Referral Priority:   Routine    Referral Type:   Consultation    Referral Reason:   Specialty Services Required    Requested Specialty:   Dermatology    Number of Visits Requested:   1    No orders of the defined types were placed in this encounter.    [name], MD [place]

## 2017-06-29 NOTE — Patient Instructions (Signed)
Nice to see you. We will get you referred to dermatology. Please try to eat a third meal per day as I think this will help maintain your weight. Please use salicylic acid pads for the wart on your foot.

## 2017-07-01 DIAGNOSIS — B070 Plantar wart: Secondary | ICD-10-CM | POA: Insufficient documentation

## 2017-07-01 DIAGNOSIS — L819 Disorder of pigmentation, unspecified: Secondary | ICD-10-CM | POA: Insufficient documentation

## 2017-07-01 NOTE — Assessment & Plan Note (Signed)
Thus far patient has had unremarkable work-up for his weight loss.  He has drastically changed his diet and I feel like his weight loss is most likely related to dietary changes and inadequate calorie intake.  I discussed having him try to get a third meal per day.  He will continue to eat healthfully.  We will refer him to dermatology for evaluation of the hyperpigmented lesion on the dorsum of his left second toe.  I discussed that this could be a benign lesion though there is always the possibility that a lesion like this could be cancerous.

## 2017-07-01 NOTE — Assessment & Plan Note (Signed)
Discussed salicylic acid pads.  Advised this could take several months to fully work.  If not beneficial we could do cryotherapy.

## 2017-07-01 NOTE — Assessment & Plan Note (Signed)
Patient with rare symptoms now.  Discussed potentially treating with a PPI or Zantac though he deferred this.  He will watch for dietary triggers.

## 2017-07-01 NOTE — Assessment & Plan Note (Signed)
Indeterminate cause.  Refer to dermatology for evaluation.

## 2017-07-02 NOTE — Progress Notes (Signed)
scheduled

## 2017-07-09 DIAGNOSIS — D229 Melanocytic nevi, unspecified: Secondary | ICD-10-CM | POA: Diagnosis not present

## 2017-10-01 ENCOUNTER — Ambulatory Visit: Payer: 59 | Admitting: Family Medicine

## 2017-10-01 ENCOUNTER — Encounter: Payer: Self-pay | Admitting: Family Medicine

## 2017-10-01 DIAGNOSIS — L819 Disorder of pigmentation, unspecified: Secondary | ICD-10-CM | POA: Diagnosis not present

## 2017-10-01 DIAGNOSIS — K219 Gastro-esophageal reflux disease without esophagitis: Secondary | ICD-10-CM

## 2017-10-01 DIAGNOSIS — R634 Abnormal weight loss: Secondary | ICD-10-CM

## 2017-10-01 DIAGNOSIS — L659 Nonscarring hair loss, unspecified: Secondary | ICD-10-CM | POA: Insufficient documentation

## 2017-10-01 NOTE — Progress Notes (Signed)
  [name], MD Phone: [phone]  John Snow is a 32 y.o. male who presents today for f/u.  CC: weight loss, skin lesion, GERD, alopecia  Weight loss: weight has been stable over the last 3 months.  He is trying to eat a little more each day.  Previously he was getting an adequate amount of calories.  He has been getting 2-3 meals a day and eating clean.  No exercise.  No night sweats.  No early satiety.  Prior evaluation unremarkable.  He saw dermatology for the skin lesion on his foot.  This was felt to be benign. He does note some alopecia in his beard on the right side that has been intermittent for a long time.  He did not discuss this with dermatology.  He wonders if he should.  GERD: He notes rare symptoms.  He does not take any medication for this.  No episodes like he was having previously.  Some foods do trigger.  No blood in his stool.  No dysphagia.  Social History   Tobacco Use  Smoking Status Never Smoker  Smokeless Tobacco Never Used     ROS see history of present illness  Objective  Physical Exam Vitals:   10/01/17 1608  BP: 112/78  Pulse: 71  Temp: 98.4 F (36.9 C)  SpO2: 98%    BP Readings from Last 3 Encounters:  10/01/17 112/78  06/29/17 108/70  04/26/17 126/78   Wt Readings from Last 3 Encounters:  10/01/17 206 lb 3.2 oz (93.5 kg)  06/29/17 206 lb 3.2 oz (93.5 kg)  04/26/17 218 lb (98.9 kg)    Physical Exam  Constitutional: No distress.  HENT:  Small circular patch of alopecia in the right side of his beard  Cardiovascular: Normal rate, regular rhythm and normal heart sounds.  Pulmonary/Chest: Effort normal and breath sounds normal.  Musculoskeletal: He exhibits no edema.  Neurological: He is alert.  Skin: Skin is warm and dry. He is not diaphoretic.     Assessment/Plan: Please see individual problem list.  Weight loss Weight has stabilized.  He has been eating more food.  I suspect his weight loss is related to  inadequate caloric intake over a period of time.  Evaluation thus far has not revealed a specific cause.  He will continue to eat adequately and monitor for weight loss.  Will recheck in several months.  He will let us know if he starts to lose weight again.  GERD (gastroesophageal reflux disease) Stable.  He will monitor for recurrence of symptoms.  Alopecia He will contact dermatology for evaluation.  Hyperpigmented skin lesion Found to be benign by dermatology.  He will keep an eye on this area.   No orders of the defined types were placed in this encounter.   No orders of the defined types were placed in this encounter.    [name], MD [place]

## 2017-10-01 NOTE — Assessment & Plan Note (Signed)
He will contact dermatology for evaluation.

## 2017-10-01 NOTE — Assessment & Plan Note (Signed)
Found to be benign by dermatology.  He will keep an eye on this area.

## 2017-10-01 NOTE — Patient Instructions (Addendum)
Nice to see you. Please continue to eat well and try to eat eat an adequate amount each day.  Please watch your weight and if it starts to trend down again please let us know. Please contact your dermatologist regarding the hair loss in your beard.

## 2017-10-01 NOTE — Assessment & Plan Note (Signed)
Weight has stabilized.  He has been eating more food.  I suspect his weight loss is related to inadequate caloric intake over a period of time.  Evaluation thus far has not revealed a specific cause.  He will continue to eat adequately and monitor for weight loss.  Will recheck in several months.  He will let us know if he starts to lose weight again.

## 2017-10-01 NOTE — Assessment & Plan Note (Signed)
Stable.  He will monitor for recurrence of symptoms.

## 2017-11-12 ENCOUNTER — Ambulatory Visit (INDEPENDENT_AMBULATORY_CARE_PROVIDER_SITE_OTHER): Payer: 59

## 2017-11-12 ENCOUNTER — Encounter: Payer: Self-pay | Admitting: Internal Medicine

## 2017-11-12 ENCOUNTER — Ambulatory Visit: Payer: 59 | Admitting: Internal Medicine

## 2017-11-12 VITALS — BP 130/74 | HR 101 | Temp 98.3°F | Resp 15 | Ht 70.0 in | Wt 203.2 lb

## 2017-11-12 DIAGNOSIS — M546 Pain in thoracic spine: Secondary | ICD-10-CM | POA: Diagnosis not present

## 2017-11-12 DIAGNOSIS — R5383 Other fatigue: Secondary | ICD-10-CM

## 2017-11-12 DIAGNOSIS — Z125 Encounter for screening for malignant neoplasm of prostate: Secondary | ICD-10-CM | POA: Diagnosis not present

## 2017-11-12 DIAGNOSIS — E559 Vitamin D deficiency, unspecified: Secondary | ICD-10-CM | POA: Diagnosis not present

## 2017-11-12 DIAGNOSIS — M545 Low back pain, unspecified: Secondary | ICD-10-CM

## 2017-11-12 DIAGNOSIS — M549 Dorsalgia, unspecified: Secondary | ICD-10-CM | POA: Diagnosis not present

## 2017-11-12 NOTE — Patient Instructions (Addendum)
  You can add up to 2000 mg of acetominophen (tylenol) every day safely  In divided doses (500 mg every 6 hours  Or 1000 mg every 12 hours.)   if you resume ibuprofen  on a daily basic,  Resume omeprazole or pantoprazole  Once daily in the morning  to protect stomach   plain x rays today of thoracic and lumbar  Spine   psa can be repeated  On or after October 12    Lab Results  Component Value Date   PSA 0.5 12/15/2016   PSA 0.45 12/27/2015    For constipation   You can  Use colace 100 to 200 mg daily  And any bulk  forming laxative like metamucil on a daily basis

## 2017-11-12 NOTE — Progress Notes (Signed)
Subjective:  Patient ID: John Snow, male    DOB: [birth year]  Age: 32 y.o. MRN: [id number]  CC: The primary encounter diagnosis was Acute right-sided thoracic back pain. Diagnoses of Vitamin D deficiency, Fatigue, unspecified type, Screening for prostate cancer, and Acute right-sided low back pain without sciatica were also pertinent to this visit.  HPI John Snow presents for EVALUATION OF PERSISTENT LOW BACK PAIN THAT RADIATES TO UPPER LATERAL THORACIC SPINE WHEN HE SITS DOWN.  Pain has been present for 3 weeks ,  And was severe, rated an 8/10 several days ago , but at baseline is usually a 4/10.  The pain is present constantly even when waking.    No recent heavy lifitng or unusual activity,  Denies urinary symptoms.   Still losing weight gradually and unintentionally: 15 lbs this year,  72 lbs   Since 2017  Outpatient Medications Prior to Visit  Medication Sig Dispense Refill  . cholecalciferol (VITAMIN D) 1000 units tablet Take 1,000 Units by mouth daily.     No facility-administered medications prior to visit.     Review of Systems;  Patient denies headache, fevers, malaise, unintentional weight loss, skin rash, eye pain, sinus congestion and sinus pain, sore throat, dysphagia,  hemoptysis , cough, dyspnea, wheezing, chest pain, palpitations, orthopnea, edema, abdominal pain, nausea, melena, diarrhea, constipation, flank pain, dysuria, hematuria, urinary  Frequency, nocturia, numbness, tingling, seizures,  Focal weakness, Loss of consciousness,  Tremor, insomnia, depression, anxiety, and suicidal ideation.      Objective:  BP 130/74 (BP Location: Left Arm, Patient Position: Sitting, Cuff Size: Normal)   Pulse (!) 101   Temp 98.3 F (36.8 C) (Oral)   Resp 15   Ht 5\' 10"  (1.778 m)   Wt 203 lb 3.2 oz (92.2 kg)   SpO2 96%   BMI 29.16 kg/m   BP Readings from Last 3 Encounters:  11/12/17 130/74  10/01/17 112/78  06/29/17 108/70    Wt Readings from Last 3  Encounters:  11/12/17 203 lb 3.2 oz (92.2 kg)  10/01/17 206 lb 3.2 oz (93.5 kg)  06/29/17 206 lb 3.2 oz (93.5 kg)    General appearance: alert, cooperative and appears stated age Neck: no adenopathy, no carotid bruit, supple, symmetrical, trachea midline and thyroid not enlarged, symmetric, no tenderness/mass/nodules Back: symmetric, no curvature. ROM normal. No CVA tenderness. Lungs: clear to auscultation bilaterally Heart: regular rate and rhythm, S1, S2 normal, no murmur, click, rub or gallop Abdomen: soft, non-tender; bowel sounds normal; no masses,  no organomegaly Pulses: 2+ and symmetric Skin: Skin color, texture, turgor normal. No rashes or lesions Lymph nodes: Cervical, supraclavicular, and axillary nodes normal.  Lab Results  Component Value Date   HGBA1C 5.5 04/26/2017   HGBA1C 5.6 12/27/2015    Lab Results  Component Value Date   CREATININE 1.19 11/12/2017   CREATININE 1.07 04/26/2017   CREATININE 1.14 12/15/2016    Lab Results  Component Value Date   WBC 6.4 11/12/2017   HGB 17.3 (H) 11/12/2017   HCT 52.0 11/12/2017   PLT 276.0 11/12/2017   GLUCOSE 85 11/12/2017   CHOL 191 08/05/2015   TRIG 122.0 08/05/2015   HDL 55.70 08/05/2015   LDLCALC 111 (H) 08/05/2015   ALT 10 11/12/2017   AST 16 11/12/2017   NA 138 11/12/2017   K 4.3 11/12/2017   CL 104 11/12/2017   CREATININE 1.19 11/12/2017   BUN 12 11/12/2017   CO2 26 11/12/2017   TSH 2.65 04/26/2017   PSA  0.5 12/15/2016   HGBA1C 5.5 04/26/2017    Dg Ugi W/o Kub  Result Date: 11/10/2016 CLINICAL DATA:  Upper abdominal pain and intermittent dysphagia EXAM: UPPER GI SERIES WITH KUB TECHNIQUE: After obtaining a scout radiograph a routine upper GI series was performed using thin and high density barium. FLUOROSCOPY TIME:  Fluoroscopy Time:  1 minutes 48 seconds Radiation Exposure Index (if provided by the fluoroscopic device): 87.0 mGy Number of Acquired Spot Images: 16 COMPARISON:  None. FINDINGS: Preliminary  abdominal radiograph shows a normal gas pattern. There is moderate stool in colon. No abnormal calcifications. Esophagus, stomach, and duodenum were visualized. Swallowing and peristalsis appear normal. There is no appreciable hiatal hernia, reflux, or stricture. No mucosal lesion is identified. There is no esophageal mass or ulceration. Pharynx appears normal. There is mild thickening of gastric folds throughout the body and proximal antrum of the stomach. The distal antrum appears normal. There is no gastric mass or ulceration. There is mild fold thickening in the proximal duodenum. There is no duodenum mass or ulceration. IMPRESSION: Fold thickening in much of the stomach and proximal duodenum consistent with a degree of gastritis and proximal duodenitis. No gastric or duodenal mass or ulceration. Esophagus appears normal. In particular, there is noted demonstrable hiatal hernia, reflux, or stricture. No masses or ulcerations identified in the esophagus. Electronically Signed   By: Lowella Grip III M.D.   On: 11/10/2016 08:37    Assessment & Plan:   Problem List Items Addressed This Visit    Acute lumbar back pain    Exam is normal as are plain films.  Recommend core strengthening exercise.s,  Judicious use of NSAIDs.       Acute right-sided thoracic back pain - Primary    Exam is normal.  Plain films are pending,  But suspect muscle strain       Relevant Orders   DG Lumbar Spine Complete (Completed)   DG Thoracic Spine W/Swimmers    Other Visit Diagnoses    Vitamin D deficiency       Relevant Orders   VITAMIN D 25 Hydroxy (Vit-D Deficiency, Fractures) (Completed)   Fatigue, unspecified type       Relevant Orders   CBC with Differential/Platelet (Completed)   PSA   Screening for prostate cancer       Relevant Orders   Comprehensive metabolic panel (Completed)     A total of 25 minutes of face to face time was spent with patient more than half of which was spent in counselling  about the above mentioned conditions  and coordination of care    I am having Woodward Holian maintain his cholecalciferol.  No orders of the defined types were placed in this encounter.   There are no discontinued medications.  Follow-up: No follow-ups on file.   Crecencio Mc, MD

## 2017-11-13 ENCOUNTER — Encounter: Payer: Self-pay | Admitting: *Deleted

## 2017-11-13 DIAGNOSIS — M545 Low back pain, unspecified: Secondary | ICD-10-CM | POA: Insufficient documentation

## 2017-11-13 DIAGNOSIS — M546 Pain in thoracic spine: Secondary | ICD-10-CM | POA: Insufficient documentation

## 2017-11-13 LAB — CBC WITH DIFFERENTIAL/PLATELET
Basophils Absolute: 0 10*3/uL (ref 0.0–0.1)
Basophils Relative: 0.5 % (ref 0.0–3.0)
Eosinophils Absolute: 0 10*3/uL (ref 0.0–0.7)
Eosinophils Relative: 0.6 % (ref 0.0–5.0)
HCT: 52 % (ref 39.0–52.0)
Hemoglobin: 17.3 g/dL — ABNORMAL HIGH (ref 13.0–17.0)
Lymphocytes Relative: 26.5 % (ref 12.0–46.0)
Lymphs Abs: 1.7 10*3/uL (ref 0.7–4.0)
MCHC: 33.4 g/dL (ref 30.0–36.0)
MCV: 93.4 fl (ref 78.0–100.0)
Monocytes Absolute: 0.5 10*3/uL (ref 0.1–1.0)
Monocytes Relative: 8.1 % (ref 3.0–12.0)
Neutro Abs: 4.1 10*3/uL (ref 1.4–7.7)
Neutrophils Relative %: 64.3 % (ref 43.0–77.0)
Platelets: 276 10*3/uL (ref 150.0–400.0)
RBC: 5.56 Mil/uL (ref 4.22–5.81)
RDW: 13.1 % (ref 11.5–15.5)
WBC: 6.4 10*3/uL (ref 4.0–10.5)

## 2017-11-13 LAB — COMPREHENSIVE METABOLIC PANEL
ALT: 10 U/L (ref 0–53)
AST: 16 U/L (ref 0–37)
Albumin: 4.8 g/dL (ref 3.5–5.2)
Alkaline Phosphatase: 38 U/L — ABNORMAL LOW (ref 39–117)
BUN: 12 mg/dL (ref 6–23)
CO2: 26 mEq/L (ref 19–32)
Calcium: 10 mg/dL (ref 8.4–10.5)
Chloride: 104 mEq/L (ref 96–112)
Creatinine, Ser: 1.19 mg/dL (ref 0.40–1.50)
GFR: 90.35 mL/min (ref >60.00)
Glucose, Bld: 85 mg/dL (ref 70–99)
Potassium: 4.3 mEq/L (ref 3.5–5.1)
Sodium: 138 mEq/L (ref 135–145)
Total Bilirubin: 0.6 mg/dL (ref 0.2–1.2)
Total Protein: 8.5 g/dL — ABNORMAL HIGH (ref 6.0–8.3)

## 2017-11-13 LAB — VITAMIN D 25 HYDROXY (VIT D DEFICIENCY, FRACTURES): VITD: 24.26 ng/mL — ABNORMAL LOW (ref 30.00–100.00)

## 2017-11-13 NOTE — Assessment & Plan Note (Signed)
Exam is normal.  Plain films are pending,  But suspect muscle strain

## 2017-11-13 NOTE — Assessment & Plan Note (Signed)
Exam is normal as are plain films.  Recommend core strengthening exercise.s,  Judicious use of NSAIDs.

## 2018-01-14 ENCOUNTER — Other Ambulatory Visit (HOSPITAL_COMMUNITY)
Admission: RE | Admit: 2018-01-14 | Discharge: 2018-01-14 | Disposition: A | Discharge status: Home / Self Care | Payer: 59 | Source: Ambulatory Visit | Attending: Family Medicine | Admitting: Family Medicine

## 2018-01-14 ENCOUNTER — Telehealth: Payer: Self-pay | Admitting: Family Medicine

## 2018-01-14 ENCOUNTER — Ambulatory Visit: Payer: 59 | Admitting: Family Medicine

## 2018-01-14 ENCOUNTER — Encounter: Payer: Self-pay | Admitting: Family Medicine

## 2018-01-14 VITALS — BP 118/74 | HR 84 | Temp 98.1°F | Ht 70.0 in | Wt 207.0 lb

## 2018-01-14 DIAGNOSIS — M546 Pain in thoracic spine: Secondary | ICD-10-CM

## 2018-01-14 DIAGNOSIS — R369 Urethral discharge, unspecified: Secondary | ICD-10-CM | POA: Diagnosis not present

## 2018-01-14 DIAGNOSIS — R1031 Right lower quadrant pain: Secondary | ICD-10-CM | POA: Diagnosis not present

## 2018-01-14 DIAGNOSIS — R360 Urethral discharge without blood: Secondary | ICD-10-CM | POA: Insufficient documentation

## 2018-01-14 DIAGNOSIS — R1030 Lower abdominal pain, unspecified: Secondary | ICD-10-CM | POA: Insufficient documentation

## 2018-01-14 DIAGNOSIS — Z8042 Family history of malignant neoplasm of prostate: Secondary | ICD-10-CM | POA: Diagnosis not present

## 2018-01-14 NOTE — Telephone Encounter (Signed)
Patient called [facility] over the weekend with complaint he had pulled a groin muscle and had discharge coming from Penis when urinating per [facility] message, left detailed message for patient that if had not been seen over the weekend to please call the office and schedule.

## 2018-01-14 NOTE — Assessment & Plan Note (Signed)
I suspect muscular strain.  This is resolved.  If it recurs he will let us know.

## 2018-01-14 NOTE — Assessment & Plan Note (Signed)
I wonder if this could have represented prostatic versus other glandular discharge from straining though we will evaluate for gonorrhea, chlamydia, and trichomonas with urine testing.  He is asymptomatic.

## 2018-01-14 NOTE — Assessment & Plan Note (Signed)
Seems to be more rib discomfort now.  Suspect muscular strain.  Prior imaging was unremarkable.  Offered referral to sports medicine for further evaluation though he deferred.  If it worsens or persist he will contact us for a referral.

## 2018-01-14 NOTE — Assessment & Plan Note (Signed)
PSA ordered

## 2018-01-14 NOTE — Patient Instructions (Addendum)
Nice to see you. Please monitor for recurrence of your symptoms.  If they begin to recur please let us know. We will check a urine study and your PSA today.

## 2018-01-14 NOTE — Progress Notes (Signed)
[name], MD Phone: [phone]  John Snow is a 32 y.o. male who presents today for same day visit.  CC: Muscle spasm, penile discharge, right rib pain, family history of prostate cancer  Muscle spasm: Patient notes 2 weeks ago developing spasm in his right inner thigh near the proximal attachment of his medial thigh muscles.  He notes no injury.  States it possibly could have been a result of sexual intercourse with his wife.  He noted standing or sitting in certain positions made it hurt.  He notes it has resolved at this time.  Tylenol is beneficial.  Penile discharge: Patient notes on a single occasion after having a bowel movement that he had clear penile discharge.  He notes this has not recurred.  This occurred last Thursday.  He notes no dysuria, blood in his urine, abdominal pain, or testicular pain.  He sexually active with one male partner.  Right rib pain: Patient notes intermittent sharp discomfort in his right ribs and back over the last 6 to 7 months.  He notes no specific injury though he may have hurt himself when playing ball with his twins.  Notes if he leans or twists a certain way it bothers him.  He had unremarkable lumbar and thoracic spine x-rays previously.  Prior chest x-ray unremarkable near the start of his symptoms.  Family history of prostate cancer: Patient wonders if he is due for repeat PSA.  The patient's father got prostate cancer at age 32.  Social History   Tobacco Use  Smoking Status Never Smoker  Smokeless Tobacco Never Used     ROS see history of present illness  Objective  Physical Exam Vitals:   01/14/18 1517  BP: 118/74  Pulse: 84  Temp: 98.1 F (36.7 C)  SpO2: 100%    BP Readings from Last 3 Encounters:  01/14/18 118/74  11/12/17 130/74  10/01/17 112/78   Wt Readings from Last 3 Encounters:  01/14/18 207 lb (93.9 kg)  11/12/17 203 lb 3.2 oz (92.2 kg)  10/01/17 206 lb 3.2 oz (93.5 kg)    Physical Exam    Constitutional: No distress.  Cardiovascular: Normal rate, regular rhythm and normal heart sounds.  Pulmonary/Chest: Effort normal and breath sounds normal.  Genitourinary:  Genitourinary Comments: Circumcised penis with no discharge noted, normal scrotum, normal testicles, normal epididymis, normal vas deferens, no inguinal hernias  Musculoskeletal: He exhibits no edema.  No medial right thigh tenderness or proximal insertion tenderness, no apparent groin tenderness, no inguinal hernias noted, no midline spine tenderness, no midline spine step-off, no muscular back tenderness, no right rib tenderness, no right rib defects noted  Neurological: He is alert.  Skin: Skin is warm and dry. He is not diaphoretic.     Assessment/Plan: Please see individual problem list.  Family history of prostate cancer in father PSA ordered.  Acute right-sided thoracic back pain Seems to be more rib discomfort now.  Suspect muscular strain.  Prior imaging was unremarkable.  Offered referral to sports medicine for further evaluation though he deferred.  If it worsens or persist he will contact us for a referral.  Penile discharge, without blood I wonder if this could have represented prostatic versus other glandular discharge from straining though we will evaluate for gonorrhea, chlamydia, and trichomonas with urine testing.  He is asymptomatic.  Groin pain I suspect muscular strain.  This is resolved.  If it recurs he will let us know.   Orders Placed This Encounter  Procedures  .  PSA    No orders of the defined types were placed in this encounter.    [name], MD [place]

## 2018-01-15 LAB — PSA: PSA: 0.33 ng/mL (ref 0.10–4.00)

## 2018-01-16 LAB — URINE CYTOLOGY ANCILLARY ONLY
CHLAMYDIA, DNA PROBE: NEGATIVE
NEISSERIA GONORRHEA: NEGATIVE
Trichomonas: NEGATIVE

## 2018-01-22 ENCOUNTER — Telehealth: Payer: Self-pay | Admitting: Family Medicine

## 2018-01-22 NOTE — Telephone Encounter (Signed)
Copied from [place] [id number]. Topic: Quick Communication - Lab Results (Clinic Use ONLY) >> Jan 22, 2018 10:41 AM Myriam Forehand, [place] wrote: Called patient to inform them of  lab results. When patient returns call, triage nurse may disclose results. >> Jan 22, 2018  3:13 PM Cecelia Byars, NT wrote: Patient called back for lab results , please call [phone] before 600 pm , he would like calls after 300 in the future  >> Jan 22, 2018  4:57 PM Sheran Luz wrote: Patient calling back for lab results. Please contact patient.

## 2018-02-04 ENCOUNTER — Ambulatory Visit: Payer: 59 | Admitting: Family Medicine

## 2018-06-10 ENCOUNTER — Telehealth: Payer: Self-pay

## 2018-06-10 NOTE — Telephone Encounter (Signed)
LMTCB offering Webex visit for f/u.

## 2018-06-21 IMAGING — DX DG CHEST 2V
2 series · 2 of 2 positions shown · non-contrast
Comparison: Chest radiographs 10/30/2016 and earlier.

CLINICAL DATA: 31-year-old male with unintentional weight loss.

EXAM:
CHEST - 2 VIEW

[chest pa]
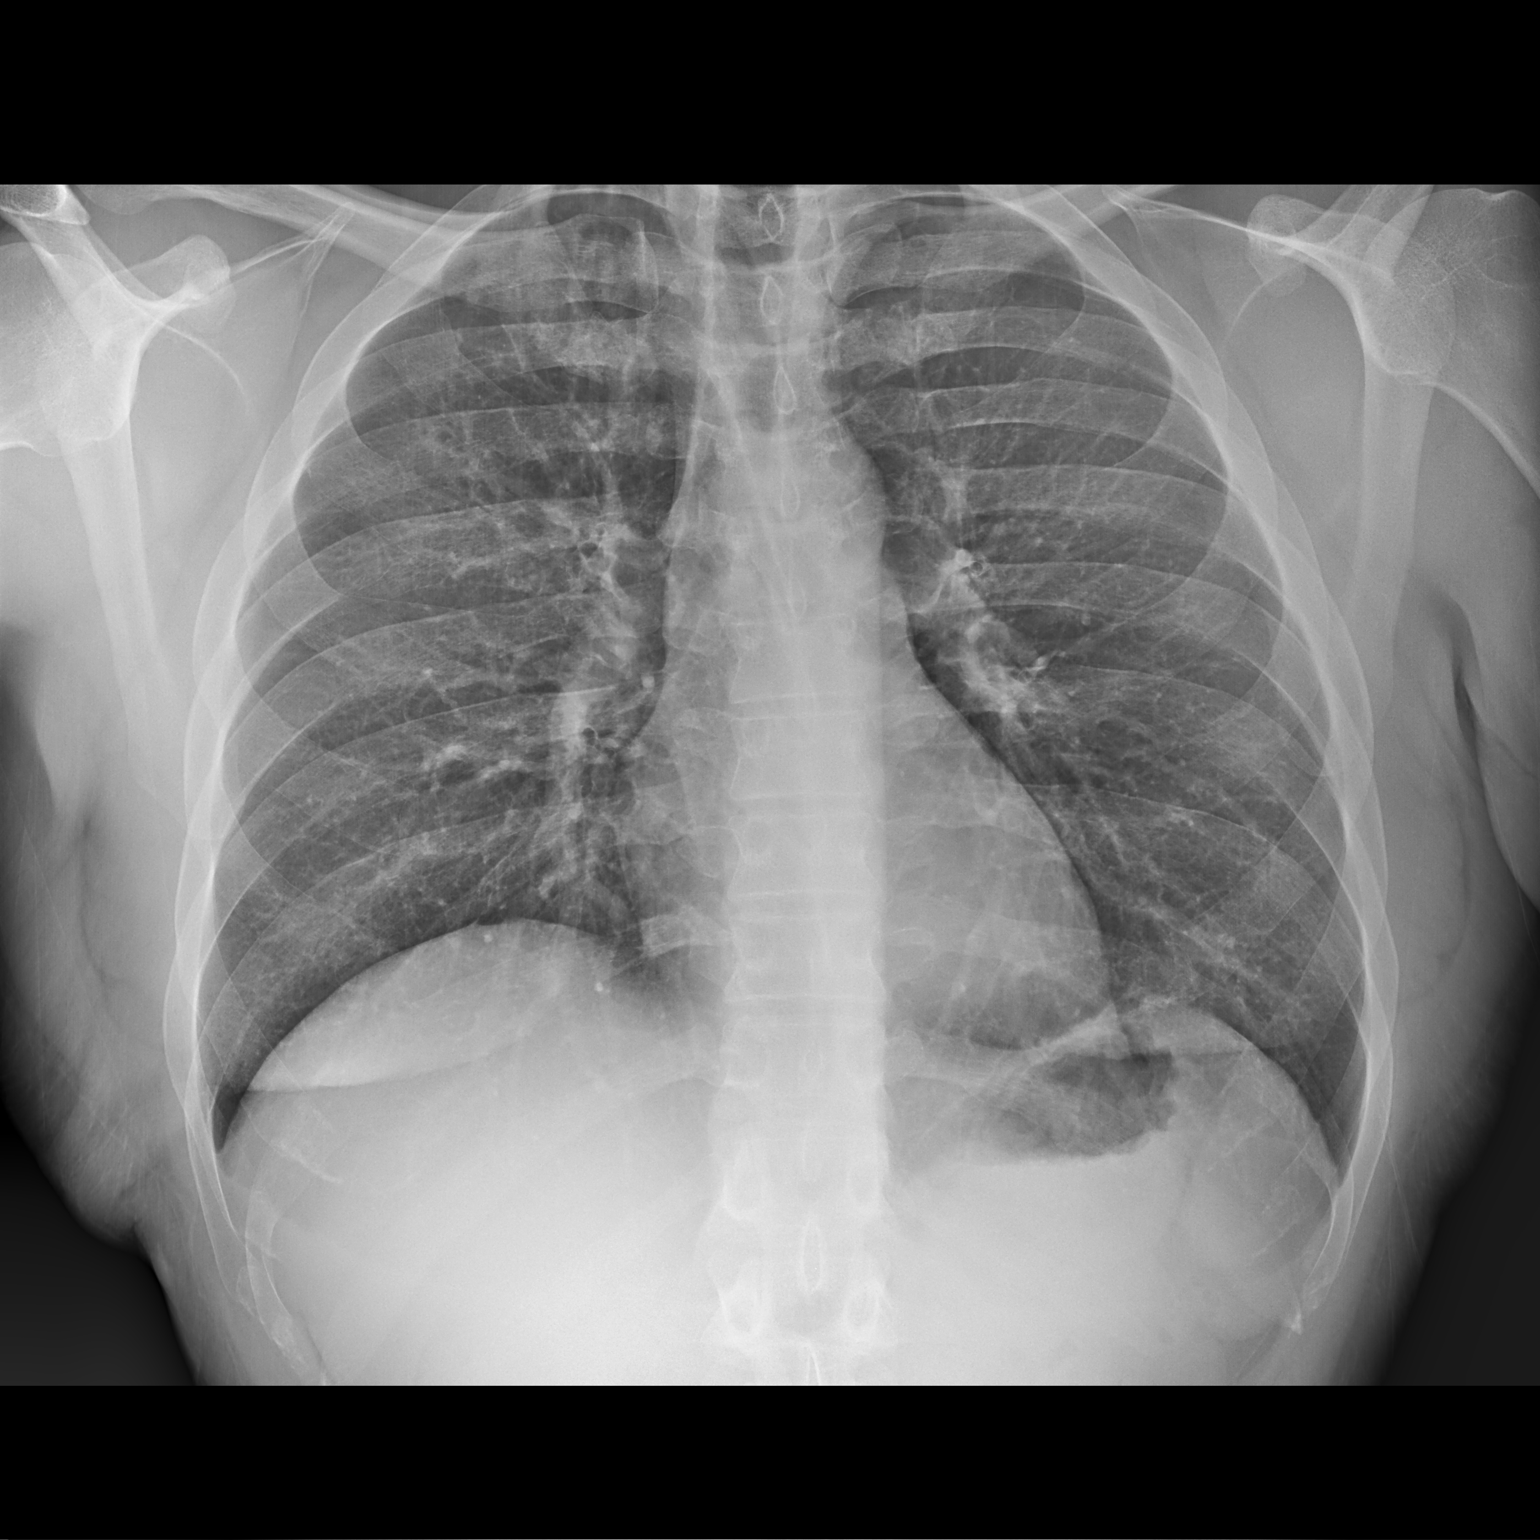

[chest lat]
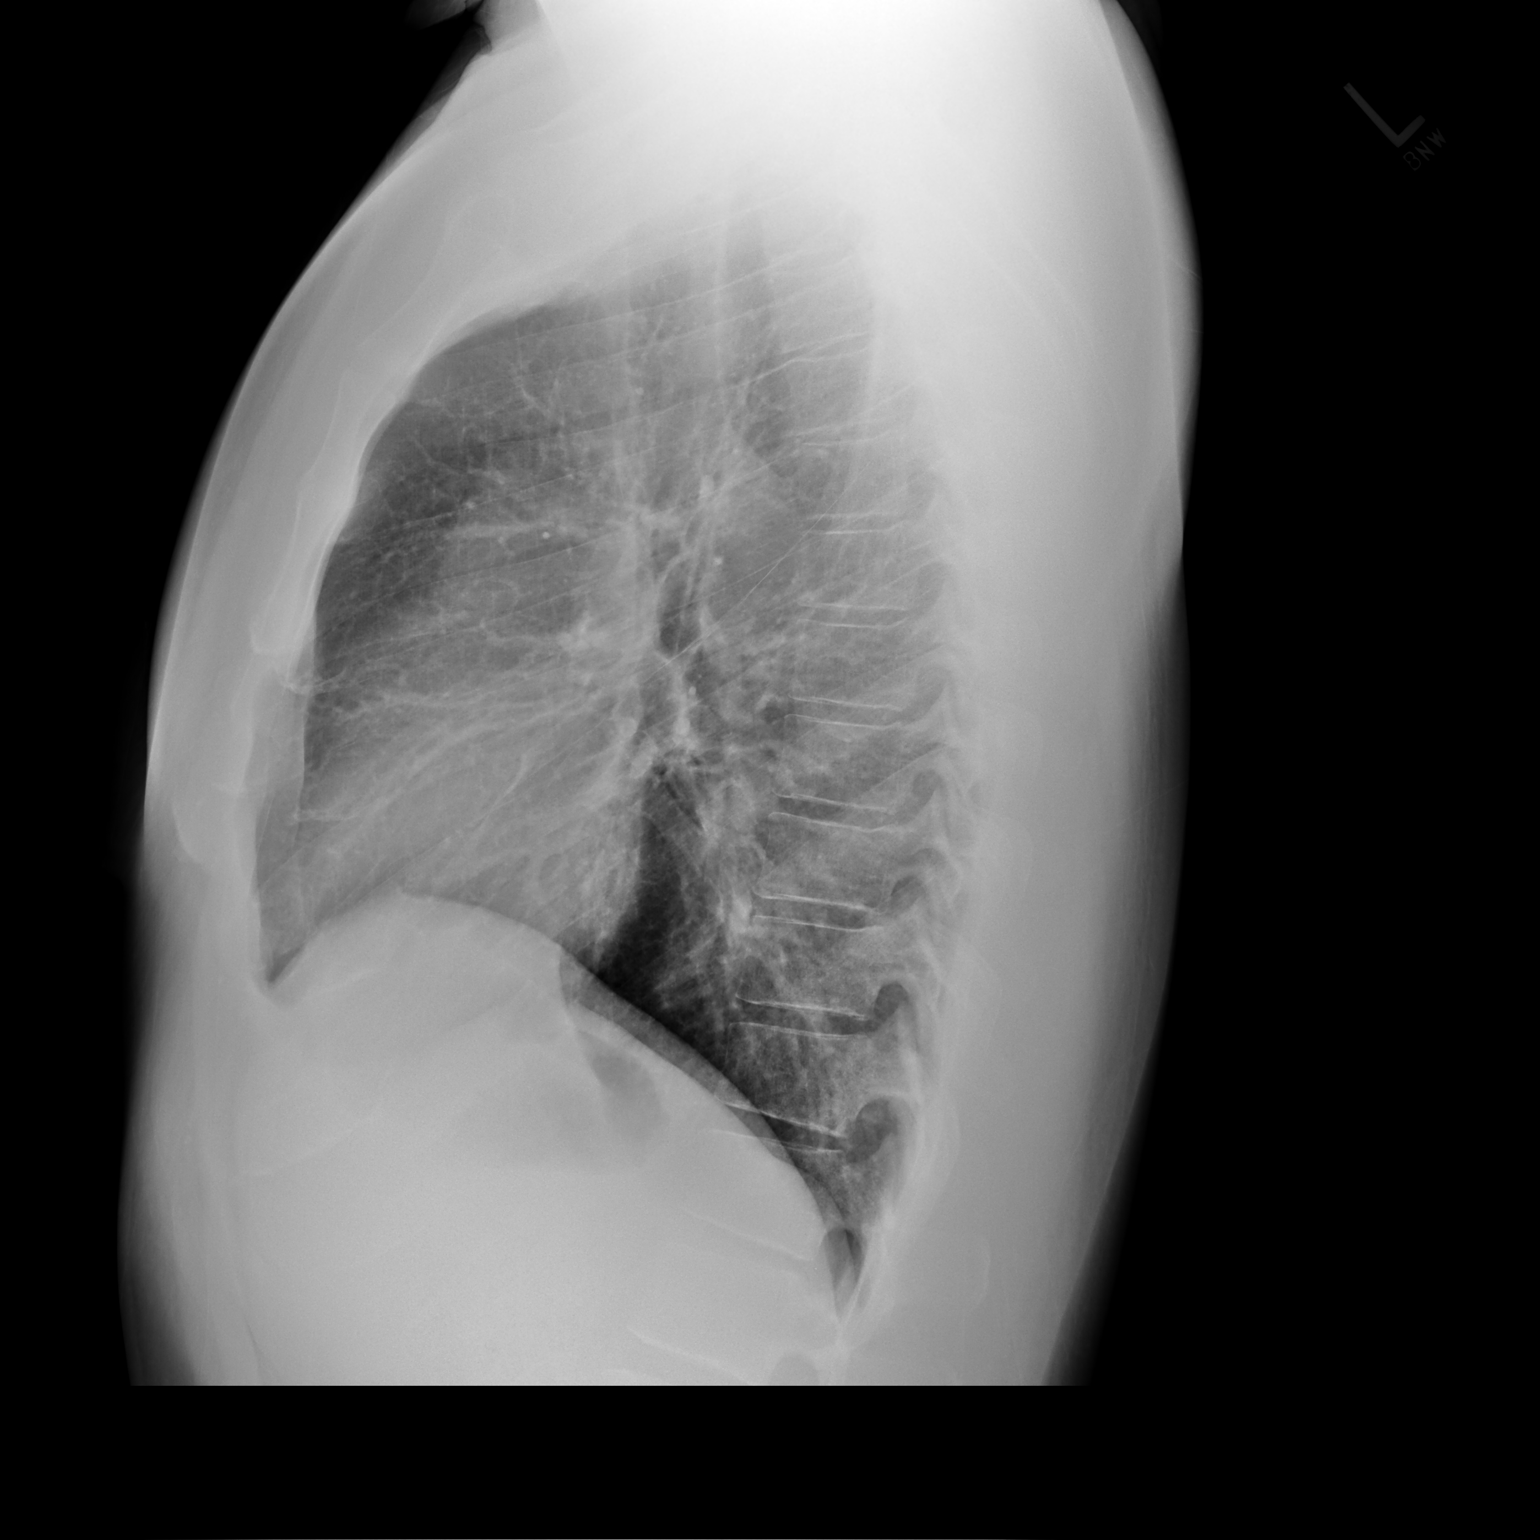

[2 of 2 positions shown; findings below may reference images not displayed]

FINDINGS: Air artifact noted at the thoracic inlet. Lung volumes remain
normal. Mild eventration of the diaphragm is stable, normal variant.
Normal cardiac size and mediastinal contours. Visualized tracheal
air column is within normal limits. The lung parenchyma appears
stable and clear bilaterally. No pneumothorax or pleural effusion.
Negative visible bowel gas pattern. No osseous abnormality
identified.
IMPRESSION: Stable and negative.  No acute cardiopulmonary abnormality.

## 2018-09-20 ENCOUNTER — Other Ambulatory Visit: Payer: Self-pay

## 2018-09-25 ENCOUNTER — Encounter: Payer: Self-pay | Admitting: Family Medicine

## 2018-09-25 ENCOUNTER — Ambulatory Visit (INDEPENDENT_AMBULATORY_CARE_PROVIDER_SITE_OTHER): Payer: BC Managed Care – PPO | Admitting: Family Medicine

## 2018-09-25 ENCOUNTER — Other Ambulatory Visit: Payer: Self-pay

## 2018-09-25 VITALS — BP 120/70 | HR 99 | Temp 98.4°F | Ht 70.0 in | Wt 226.0 lb

## 2018-09-25 DIAGNOSIS — Z0001 Encounter for general adult medical examination with abnormal findings: Secondary | ICD-10-CM | POA: Insufficient documentation

## 2018-09-25 DIAGNOSIS — E6609 Other obesity due to excess calories: Secondary | ICD-10-CM

## 2018-09-25 DIAGNOSIS — Z8042 Family history of malignant neoplasm of prostate: Secondary | ICD-10-CM

## 2018-09-25 DIAGNOSIS — Z0000 Encounter for general adult medical examination without abnormal findings: Secondary | ICD-10-CM | POA: Diagnosis not present

## 2018-09-25 DIAGNOSIS — Z6832 Body mass index (BMI) 32.0-32.9, adult: Secondary | ICD-10-CM

## 2018-09-25 LAB — COMPREHENSIVE METABOLIC PANEL
ALT: 13 U/L (ref 0–53)
AST: 18 U/L (ref 0–37)
Albumin: 4.6 g/dL (ref 3.5–5.2)
Alkaline Phosphatase: 36 U/L — ABNORMAL LOW (ref 39–117)
BUN: 15 mg/dL (ref 6–23)
CO2: 27 mEq/L (ref 19–32)
Calcium: 9.6 mg/dL (ref 8.4–10.5)
Chloride: 100 mEq/L (ref 96–112)
Creatinine, Ser: 1.09 mg/dL (ref 0.40–1.50)
GFR: 93.58 mL/min (ref >60.00)
Glucose, Bld: 84 mg/dL (ref 70–99)
Potassium: 3.7 mEq/L (ref 3.5–5.1)
Sodium: 136 mEq/L (ref 135–145)
Total Bilirubin: 0.6 mg/dL (ref 0.2–1.2)
Total Protein: 7.4 g/dL (ref 6.0–8.3)

## 2018-09-25 LAB — LIPID PANEL
Cholesterol: 193 mg/dL (ref 0–200)
HDL: 68.8 mg/dL (ref >39.00)
LDL Cholesterol: 114 mg/dL — ABNORMAL HIGH (ref 0–99)
NonHDL: 124.54
Total CHOL/HDL Ratio: 3
Triglycerides: 55 mg/dL (ref 0.0–149.0)
VLDL: 11 mg/dL (ref 0.0–40.0)

## 2018-09-25 LAB — HEMOGLOBIN A1C: Hgb A1c MFr Bld: 5.4 % (ref 4.6–6.5)

## 2018-09-25 NOTE — Assessment & Plan Note (Addendum)
Physical exam completed.  Encouraged to add activity and continue healthy diet.  Discussed prostate cancer screening given his father's young age of diagnosis and death related to prostate cancer.  He will be due for PSA in November and this has been ordered.  He has declined tetanus vaccine and flu vaccine though he will let us know if he changes his mind.  I encouraged him to get set up with a dentist.  He will monitor the likely ingrown hair.  If it does not resolve or if it worsens he will let us know right away.  Labs as outlined below.

## 2018-09-25 NOTE — Progress Notes (Signed)
[name], MD Phone: [phone]  John Snow is a 33 y.o. male who presents today for CPE.  Exercise: He just bought a bike and is going to start riding.  Activity has been somewhat limited with the COVID-19 pandemic. Diet consists of eating very healthily.  Drinking only water.  He has gained a little bit of weight back as he was not eating enough previously and had lost quite a bit of weight.  He had a negative work-up regarding this. Does report a family history of prostate cancer in his father at age 57. No family history of colon cancer. He defers tetanus vaccine and flu vaccine. He is sexually active with his wife. No tobacco use or illicit drug use.  Rarely drinks alcohol. He was scheduled with the dentist though they had to cancel related to the COVID-19 pandemic.  He does not see an ophthalmologist. He reports a possible ingrown hair on his left cheek on his beard line.  Has been there about a week and a half.  He noticed it after using his own clippers.  Active Ambulatory Problems    Diagnosis Date Noted  . History of MI (myocardial infarction) 05/28/2015  . Obesity 05/28/2015  . Palpitations 07/26/2015  . Allergic rhinitis 12/27/2015  . Shift work sleep disorder 12/27/2015  . Family history of prostate cancer in father 12/27/2015  . Hypersomnia 12/15/2016  . GERD (gastroesophageal reflux disease) 04/26/2017  . Hyperpigmented skin lesion 07/01/2017  . Plantar wart of left foot 07/01/2017  . Alopecia 10/01/2017  . Acute right-sided thoracic back pain 11/13/2017  . Acute lumbar back pain 11/13/2017  . Groin pain 01/14/2018  . Routine general medical examination at a health care facility 09/25/2018   Resolved Ambulatory Problems    Diagnosis Date Noted  . Acute upper respiratory infection 05/28/2015  . Sore throat 09/02/2015  . Pharyngitis 06/01/2016  . Influenza B 06/06/2016  . Tingling 12/15/2016  . Weight loss 04/26/2017  . Penile discharge, without  blood 01/14/2018   Past Medical History:  Diagnosis Date  . Heart attack ([place]) 2007    Family History  Problem Relation Age of Onset  . Cancer Father 68       Not prostate cancer  . Hypertension Maternal Grandmother     Social History   Socioeconomic History  . Marital status: Married    Spouse name: Not on file  . Number of children: Not on file  . Years of education: Not on file  . Highest education level: Not on file  Occupational History  . Not on file  Social Needs  . Financial resource strain: Not on file  . Food insecurity    Worry: Not on file    Inability: Not on file  . Transportation needs    Medical: Not on file    Non-medical: Not on file  Tobacco Use  . Smoking status: Never Smoker  . Smokeless tobacco: Never Used  Substance and Sexual Activity  . Alcohol use: Yes    Alcohol/week: 0.0 standard drinks    Comment: occasional  . Drug use: No  . Sexual activity: Not on file  Lifestyle  . Physical activity    Days per week: Not on file    Minutes per session: Not on file  . Stress: Not on file  Relationships  . Social Herbalist on phone: Not on file    Gets together: Not on file    Attends religious service: Not on file  Active member of club or organization: Not on file    Attends meetings of clubs or organizations: Not on file    Relationship status: Not on file  . Intimate partner violence    Fear of current or ex partner: Not on file    Emotionally abused: Not on file    Physically abused: Not on file    Forced sexual activity: Not on file  Other Topics Concern  . Not on file  Social History Narrative  . Not on file    ROS  General:  Negative for nexplained weight loss, fever Skin: Negative for new or changing mole, sore that won't heal HEENT: Negative for trouble hearing, trouble seeing, ringing in ears, mouth sores, hoarseness, change in voice, dysphagia. CV:  Negative for chest pain, dyspnea, edema, palpitations Resp:  Negative for cough, dyspnea, hemoptysis GI: Negative for nausea, vomiting, diarrhea, constipation, abdominal pain, melena, hematochezia. GU: Negative for dysuria, incontinence, urinary hesitance, hematuria, vaginal or penile discharge, polyuria, sexual difficulty, lumps in testicle or breasts MSK: Negative for muscle cramps or aches, joint pain or swelling Neuro: Negative for headaches, weakness, numbness, dizziness, passing out/fainting Psych: Negative for depression, anxiety, memory problems  Objective  Physical Exam Vitals:   09/25/18 1342  BP: 120/70  Pulse: 99  Temp: 98.4 F (36.9 C)  SpO2: 98%    BP Readings from Last 3 Encounters:  09/25/18 120/70  01/14/18 118/74  11/12/17 130/74   Wt Readings from Last 3 Encounters:  09/25/18 226 lb (102.5 kg)  01/14/18 207 lb (93.9 kg)  11/12/17 203 lb 3.2 oz (92.2 kg)    Physical Exam Constitutional:      General: He is not in acute distress.    Appearance: He is not diaphoretic.  HENT:     Head: Normocephalic and atraumatic.   Eyes:     Conjunctiva/sclera: Conjunctivae normal.     Pupils: Pupils are equal, round, and reactive to light.  Cardiovascular:     Rate and Rhythm: Normal rate and regular rhythm.     Heart sounds: Normal heart sounds.  Pulmonary:     Effort: Pulmonary effort is normal.     Breath sounds: Normal breath sounds.  Abdominal:     General: Bowel sounds are normal. There is no distension.     Palpations: Abdomen is soft.     Tenderness: There is no abdominal tenderness. There is no guarding or rebound.  Musculoskeletal:     Right lower leg: No edema.     Left lower leg: No edema.  Lymphadenopathy:     Cervical: No cervical adenopathy.  Skin:    General: Skin is warm and dry.  Neurological:     Mental Status: He is alert.  Psychiatric:        Mood and Affect: Mood normal.      Assessment/Plan:   Routine general medical examination at a health care facility Physical exam completed.   Encouraged to add activity and continue healthy diet.  Discussed prostate cancer screening given his father's young age of diagnosis and death related to prostate cancer.  He will be due for PSA in November and this has been ordered.  He has declined tetanus vaccine and flu vaccine though he will let us know if he changes his mind.  I encouraged him to get set up with a dentist.  He will monitor the likely ingrown hair.  If it does not resolve or if it worsens he will let us know right away.  Labs as outlined below.   Orders Placed This Encounter  Procedures  . Comp Met (CMET)  . Lipid panel  . HgB A1c  . PSA    Standing Status:   Future    Standing Expiration Date:   09/25/2019    No orders of the defined types were placed in this encounter.    [name], MD [facility]

## 2018-09-25 NOTE — Patient Instructions (Signed)
Nice to see you. We will get lab work today and contact you with the results. Please add in some exercise several days a week. Please continue to eat healthfully.

## 2018-09-26 ENCOUNTER — Encounter: Payer: Self-pay | Admitting: Family Medicine

## 2018-09-26 ENCOUNTER — Telehealth: Payer: Self-pay | Admitting: Family Medicine

## 2018-09-26 NOTE — Telephone Encounter (Signed)
I called pt and left a vm stating to call ofc to schedule 4 months (around 01/26/2019) for Lab, 1 year for CPE.

## 2018-09-27 ENCOUNTER — Telehealth: Payer: Self-pay

## 2018-09-27 ENCOUNTER — Encounter: Payer: Self-pay | Admitting: Family Medicine

## 2018-09-27 DIAGNOSIS — Z130 Encounter for screening for diseases of the blood and blood-forming organs and certain disorders involving the immune mechanism: Secondary | ICD-10-CM

## 2018-09-27 NOTE — Addendum Note (Signed)
Addended by: Leone Haven on: 09/27/2018 03:13 PM   Modules accepted: Orders

## 2018-09-27 NOTE — Telephone Encounter (Signed)
Ordered

## 2018-09-27 NOTE — Telephone Encounter (Signed)
Copied from [name] [id number]. Topic: General - Other >> Sep 27, 2018  1:27 PM John Snow wrote: Patient would like Snow callback I regards to his recent lab work. Patient would like to know why the CBC test wasn't done because patient was under assumption it would be completed.

## 2018-09-27 NOTE — Telephone Encounter (Signed)
See phone note regarding this

## 2018-09-27 NOTE — Telephone Encounter (Signed)
I called and spoke with the patient. He wanted to know why his CBC was not on his lab work, after speaking with the provider I informed him that the provider normally does the CBC once a year unless its needed, but he will add the order for CBC for his November appointment with the PSA order, patient understood and agreed to that.  Nina,cma

## 2018-11-12 ENCOUNTER — Encounter: Payer: Self-pay | Admitting: Family Medicine

## 2018-11-12 ENCOUNTER — Ambulatory Visit: Payer: BC Managed Care – PPO | Admitting: Family Medicine

## 2018-11-12 ENCOUNTER — Other Ambulatory Visit: Payer: Self-pay

## 2018-11-12 VITALS — BP 120/76 | HR 97 | Temp 96.3°F | Resp 18 | Ht 70.0 in | Wt 224.0 lb

## 2018-11-12 DIAGNOSIS — R12 Heartburn: Secondary | ICD-10-CM

## 2018-11-12 DIAGNOSIS — M79651 Pain in right thigh: Secondary | ICD-10-CM

## 2018-11-12 DIAGNOSIS — I252 Old myocardial infarction: Secondary | ICD-10-CM | POA: Diagnosis not present

## 2018-11-12 DIAGNOSIS — R1013 Epigastric pain: Secondary | ICD-10-CM

## 2018-11-12 NOTE — Progress Notes (Signed)
Subjective:    Patient ID: John Snow, male    DOB: 02-12-1986, 33 y.o.   MRN: [id number]  HPI   Patient presents to clinic complaining of epigastric pain that went up into the chest and also right thigh pain.  Patient has a history of myocardial infarction around age 28, so whenever he gets a sensation of heartburn that goes up at the chest he becomes concerned.  States this heartburn episode occurred a few days ago and went away on its own after about 20 or 30 minutes.  Denies any sweating, palpitations, feeling faint or dizzy or pain anywhere else in chest, jaw or arms.   States after he had his heart attack at age 38 he did see 2 different cardiac specialist and was reassured that everything looked good at that time.  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  Pain is on the front of the leg; states he looked up his symptoms online and became concerned he may have a blood clot.  Source.  Denies any long travel in a car or airplane. He is physically active every day.   Patient Active Problem List   Diagnosis Date Noted  . Routine general medical examination at a health care facility 09/25/2018  . Groin pain 01/14/2018  . Acute right-sided thoracic back pain 11/13/2017  . Acute lumbar back pain 11/13/2017  . Alopecia 10/01/2017  . Hyperpigmented skin lesion 07/01/2017  . Plantar wart of left foot 07/01/2017  . GERD (gastroesophageal reflux disease) 04/26/2017  . Hypersomnia 12/15/2016  . Allergic rhinitis 12/27/2015  . Shift work sleep disorder 12/27/2015  . Family history of prostate cancer in father 12/27/2015  . Palpitations 07/26/2015  . History of MI (myocardial infarction) 05/28/2015  . Obesity 05/28/2015   Social History   Tobacco Use  . Smoking status: Never Smoker  . Smokeless tobacco: Never Used  Substance Use Topics  . Alcohol use: Yes    Alcohol/week: 0.0  standard drinks    Comment: occasional   Review of Systems  Constitutional: Negative for chills, fatigue and fever.  HENT: Negative for congestion, ear pain, sinus pain and sore throat.   Eyes: Negative.   Respiratory: Negative for cough, shortness of breath and wheezing.   Cardiovascular: Negative for chest pain, palpitations and leg swelling.  Gastrointestinal: +epigastric pain that went up into chest. Negative for abdominal pain, diarrhea, nausea and vomiting.  Genitourinary: Negative for dysuria, frequency and urgency.  Musculoskeletal: Negative for arthralgias. +right upper thigh pain Skin: Negative for color change, pallor and rash.  Neurological: Negative for syncope, light-headedness and headaches.  Psychiatric/Behavioral: The patient is not nervous/anxious.       Objective:   Physical Exam Vitals signs and nursing note reviewed.  Constitutional:      General: He is not in acute distress.    Appearance: He is not ill-appearing, toxic-appearing or diaphoretic.  Eyes:     General: No scleral icterus.    Extraocular Movements: Extraocular movements intact.     Conjunctiva/sclera: Conjunctivae normal.     Pupils: Pupils are equal, round, and reactive to light.  Neck:     Musculoskeletal: Normal range of motion and neck supple. No neck rigidity.     Vascular: No carotid bruit.  Cardiovascular:     Rate and Rhythm: Normal rate and regular rhythm.     Heart sounds: Normal heart  sounds. No murmur. No friction rub. No gallop.   Pulmonary:     Effort: Pulmonary effort is normal. No respiratory distress.     Breath sounds: Normal breath sounds.  Abdominal:     General: Bowel sounds are normal. There is no distension.     Palpations: Abdomen is soft. There is no mass.     Tenderness: There is no abdominal tenderness. There is no guarding or rebound.     Hernia: No hernia is present.  Musculoskeletal:     Right lower leg: No edema.     Left lower leg: No edema.       Legs:      Comments: Location of thigh pain represented by red mark on diagram.  +2 pedal pulses bilat ROM of LEs intact  Lymphadenopathy:     Cervical: No cervical adenopathy.  Skin:    General: Skin is warm and dry.     Coloration: Skin is not jaundiced or pale.  Neurological:     General: No focal deficit present.     Mental Status: He is alert and oriented to person, place, and time.     Gait: Gait normal.  Psychiatric:        Mood and Affect: Mood normal.        Behavior: Behavior normal.    Today's Vitals   11/12/18 1443  BP: 120/76  Pulse: 97  Resp: 18  Temp: (!) 96.3 F (35.7 C)  TempSrc: Temporal  SpO2: 96%  Weight: 224 lb (101.6 kg)  Height: 5\' 10"  (1.778 m)   Body mass index is 32.14 kg/m.    Assessment & Plan:   Epigastric pain/heartburn/Hx of MI- EKG performed in clinic and reviewed by me.  EKG shows a normal sinus rhythm and no concerning changes that would indicate any sort of acute coronary syndrome.  Reassured patient that his EKG does look good.  Advised patient that the epigastric sensation he most likely experienced was from heartburn.  Advised patient in the future if he does experience a sensation he can try chewing 2 Tums tablets to see if it goes away, if he continues to have symptoms and/or is continuing to be concerned and wants to be checked, he can always call office and or go to urgent care for EKG and or to ER for full cardiac work-up.  Pain in right thigh-- advised patient that due to the location of the pain I suspect the tenderness is muscular in nature.  Patient's Wells criteria is low for concern for DVT. We will get lab work to look for any anemia, electrolyte deficiencies, vitamin deficiencies and also screen for factor V and sickle cell.  Patient will keep all regular follow-ups with PCP as planned.  I did offer referral to cardiology due to his history of MI, declines at this time.  Patient feels reassured after EKG and also being on the blood  work drawn today.

## 2018-11-13 LAB — COMPREHENSIVE METABOLIC PANEL
ALT: 11 U/L (ref 0–53)
AST: 15 U/L (ref 0–37)
Albumin: 4.7 g/dL (ref 3.5–5.2)
Alkaline Phosphatase: 38 U/L — ABNORMAL LOW (ref 39–117)
BUN: 14 mg/dL (ref 6–23)
CO2: 26 mEq/L (ref 19–32)
Calcium: 9.6 mg/dL (ref 8.4–10.5)
Chloride: 101 mEq/L (ref 96–112)
Creatinine, Ser: 0.99 mg/dL (ref 0.40–1.50)
GFR: 104.49 mL/min (ref >60.00)
Glucose, Bld: 93 mg/dL (ref 70–99)
Potassium: 4.1 mEq/L (ref 3.5–5.1)
Sodium: 135 mEq/L (ref 135–145)
Total Bilirubin: 0.6 mg/dL (ref 0.2–1.2)
Total Protein: 7.9 g/dL (ref 6.0–8.3)

## 2018-11-13 LAB — CBC WITH DIFFERENTIAL/PLATELET
Basophils Absolute: 0.1 10*3/uL (ref 0.0–0.1)
Basophils Relative: 0.8 % (ref 0.0–3.0)
Eosinophils Absolute: 0 10*3/uL (ref 0.0–0.7)
Eosinophils Relative: 0.7 % (ref 0.0–5.0)
HCT: 50.7 % (ref 39.0–52.0)
Hemoglobin: 16.6 g/dL (ref 13.0–17.0)
Lymphocytes Relative: 17.9 % (ref 12.0–46.0)
Lymphs Abs: 1.2 10*3/uL (ref 0.7–4.0)
MCHC: 32.8 g/dL (ref 30.0–36.0)
MCV: 94.3 fl (ref 78.0–100.0)
Monocytes Absolute: 0.5 10*3/uL (ref 0.1–1.0)
Monocytes Relative: 7.6 % (ref 3.0–12.0)
Neutro Abs: 4.9 10*3/uL (ref 1.4–7.7)
Neutrophils Relative %: 73 % (ref 43.0–77.0)
Platelets: 269 10*3/uL (ref 150.0–400.0)
RBC: 5.37 Mil/uL (ref 4.22–5.81)
RDW: 13.3 % (ref 11.5–15.5)
WBC: 6.7 10*3/uL (ref 4.0–10.5)

## 2018-11-13 LAB — IBC + FERRITIN
Ferritin: 58.1 ng/mL (ref 22.0–322.0)
Iron: 95 ug/dL (ref 42–165)
Saturation Ratios: 29.5 % (ref 20.0–50.0)
Transferrin: 230 mg/dL (ref 212.0–360.0)

## 2018-11-13 LAB — B12 AND FOLATE PANEL
Folate: 14 ng/mL (ref >5.9)
Vitamin B-12: 695 pg/mL (ref 211–911)

## 2018-11-13 LAB — MAGNESIUM: Magnesium: 1.9 mg/dL (ref 1.5–2.5)

## 2018-11-13 LAB — VITAMIN D 25 HYDROXY (VIT D DEFICIENCY, FRACTURES): VITD: 29.09 ng/mL — ABNORMAL LOW (ref 30.00–100.00)

## 2018-11-15 LAB — FACTOR 5 LEIDEN

## 2018-11-15 LAB — SICKLE CELL SCREEN: Sickle Solubility Test - HGBRFX: NEGATIVE

## 2018-11-18 ENCOUNTER — Encounter: Payer: Self-pay | Admitting: Family Medicine

## 2019-01-15 ENCOUNTER — Encounter: Payer: Self-pay | Admitting: Family Medicine

## 2019-01-17 ENCOUNTER — Other Ambulatory Visit: Payer: Self-pay

## 2019-01-17 ENCOUNTER — Ambulatory Visit (INDEPENDENT_AMBULATORY_CARE_PROVIDER_SITE_OTHER): Payer: BC Managed Care – PPO | Admitting: Family Medicine

## 2019-01-17 ENCOUNTER — Encounter: Payer: Self-pay | Admitting: Family Medicine

## 2019-01-17 ENCOUNTER — Ambulatory Visit (INDEPENDENT_AMBULATORY_CARE_PROVIDER_SITE_OTHER): Payer: BC Managed Care – PPO

## 2019-01-17 VITALS — BP 118/70 | HR 92 | Temp 97.9°F | Wt 213.0 lb

## 2019-01-17 DIAGNOSIS — R079 Chest pain, unspecified: Secondary | ICD-10-CM

## 2019-01-17 LAB — TROPONIN I: Troponin I: <0.01 ng/mL (ref <=0.0)

## 2019-01-17 NOTE — Progress Notes (Signed)
Subjective:    Patient ID: John Snow, male    DOB: Jan 27, 1986, 33 y.o.   MRN: [id number]  HPI   Patient presents to clinic due to wanting an EKG done.  Patient states he had an episode of chest pain on Monday, 9th of November 2020.  States she was at the gym and 5 for 3 days heartburn, then felt a warm sensation in her chest.  States after a little bit of time that resolved.  Patient has a history of heart attack at age 82, so he became concerned.  He decided to come in today to EKG.  States as the week went on, back to his 100% normal self.  No shortness of breath, palpitations, any more episodes of chest pain, swelling or feeling faint/dizzy.    Patient Active Problem List   Diagnosis Date Noted  . Routine general medical examination at a health care facility 09/25/2018  . Groin pain 01/14/2018  . Acute right-sided thoracic back pain 11/13/2017  . Acute lumbar back pain 11/13/2017  . Alopecia 10/01/2017  . Hyperpigmented skin lesion 07/01/2017  . Plantar wart of left foot 07/01/2017  . GERD (gastroesophageal reflux disease) 04/26/2017  . Hypersomnia 12/15/2016  . Allergic rhinitis 12/27/2015  . Shift work sleep disorder 12/27/2015  . Family history of prostate cancer in father 12/27/2015  . Palpitations 07/26/2015  . History of MI (myocardial infarction) 05/28/2015  . Obesity 05/28/2015   Social History   Tobacco Use  . Smoking status: Never Smoker  . Smokeless tobacco: Never Used  Substance Use Topics  . Alcohol use: Yes    Alcohol/week: 0.0 standard drinks    Comment: occasional    Review of Systems  Constitutional: Negative for chills, fatigue and fever.  HENT: Negative for congestion, ear pain, sinus pain and sore throat.   Eyes: Negative.   Respiratory: Negative for cough, shortness of breath and wheezing.   Cardiovascular: Negative for  palpitations and leg swelling. +chest pain on Monday 01/13/19  Gastrointestinal: Negative for abdominal pain, diarrhea,  nausea and vomiting.  Genitourinary: +dysuria, frequency and urgency.  Musculoskeletal: Negative for arthralgias and myalgias.  Skin: Negative for color change, pallor and rash.  Neurological: Negative for syncope, light-headedness and headaches.  Psychiatric/Behavioral: The patient is not nervous/anxious.       Objective:   Physical Exam Vitals signs and nursing note reviewed.  Constitutional:      General: He is not in acute distress.    Appearance: He is not ill-appearing, toxic-appearing or diaphoretic.  HENT:     Head: Normocephalic and atraumatic.  Eyes:     General: No scleral icterus.    Extraocular Movements: Extraocular movements intact.     Conjunctiva/sclera: Conjunctivae normal.     Pupils: Pupils are equal, round, and reactive to light.  Neck:     Musculoskeletal: Normal range of motion and neck supple. No neck rigidity.     Vascular: No carotid bruit.  Cardiovascular:     Rate and Rhythm: Normal rate and regular rhythm.     Heart sounds: Normal heart sounds. No murmur. No friction rub. No gallop.   Pulmonary:     Effort: Pulmonary effort is normal. No respiratory distress.     Breath sounds: Normal breath sounds. No stridor. No wheezing, rhonchi or rales.  Chest:     Chest wall: No tenderness.  Musculoskeletal:     Right lower leg: No edema.     Left lower leg: No edema.  Skin:  General: Skin is warm and dry.     Coloration: Skin is not jaundiced or pale.  Neurological:     General: No focal deficit present.     Mental Status: He is alert and oriented to person, place, and time.     Gait: Gait normal.  Psychiatric:        Mood and Affect: Mood normal.        Behavior: Behavior normal.       Today's Vitals   01/17/19 1456  BP: 118/70  Pulse: 92  Temp: 97.9 F (36.6 C)  TempSrc: Temporal  SpO2: 97%  Weight: 213 lb (96.6 kg)   Body mass index is 30.56 kg/m.    Assessment & Plan:    Chest pain - patient appears to be in no distress and  office today.  He states he is feeling back to his 100% normal self.  EKG performed and reviewed by me.  Computer interpretation does reveal abnormality due to QRS contour and some T wave abnormality.  Heart rate is normal.  PR interval is same today as it was on November 12, 2018, QT interval is approximately the same, was 338 on November 12, 2018 and now is 362.  Almost all leads of EKG are extremely similar to one another when compared EKG from September to today other than slight difference in V3.  We will get chest x-ray and troponin ordered stat due to this slight difference.  Patient is in no distress currently, so I think we can hold off on going to emergency department at this time until we get other testing results back.  Patient advised that if anything changes - he has another episode of chest pain develops shortness of breath, feeling faint or dizzy -- he must go to emergency room right away.  Patient aware that even if the troponin results after hours, someone will be made aware of results if they are abnormal and he will be made aware if emergency room is necessary.  Patient verbalized understanding of the need to go to ER if anything changes or any new symptoms develop.

## 2019-01-19 ENCOUNTER — Encounter: Payer: Self-pay | Admitting: Family Medicine

## 2019-01-20 ENCOUNTER — Other Ambulatory Visit: Payer: Self-pay

## 2019-01-20 ENCOUNTER — Emergency Department
Admission: EM | Admit: 2019-01-20 | Discharge: 2019-01-20 | Disposition: A | Discharge status: Home / Self Care | Payer: BC Managed Care – PPO | Attending: Emergency Medicine | Admitting: Emergency Medicine

## 2019-01-20 ENCOUNTER — Encounter: Payer: Self-pay | Admitting: Emergency Medicine

## 2019-01-20 ENCOUNTER — Emergency Department: Payer: BC Managed Care – PPO

## 2019-01-20 DIAGNOSIS — K219 Gastro-esophageal reflux disease without esophagitis: Secondary | ICD-10-CM | POA: Diagnosis not present

## 2019-01-20 DIAGNOSIS — R1010 Upper abdominal pain, unspecified: Secondary | ICD-10-CM | POA: Diagnosis not present

## 2019-01-20 DIAGNOSIS — Z79899 Other long term (current) drug therapy: Secondary | ICD-10-CM | POA: Diagnosis not present

## 2019-01-20 DIAGNOSIS — I499 Cardiac arrhythmia, unspecified: Secondary | ICD-10-CM | POA: Diagnosis not present

## 2019-01-20 DIAGNOSIS — R079 Chest pain, unspecified: Secondary | ICD-10-CM | POA: Diagnosis not present

## 2019-01-20 DIAGNOSIS — I491 Atrial premature depolarization: Secondary | ICD-10-CM | POA: Diagnosis not present

## 2019-01-20 DIAGNOSIS — I252 Old myocardial infarction: Secondary | ICD-10-CM | POA: Insufficient documentation

## 2019-01-20 DIAGNOSIS — R0789 Other chest pain: Secondary | ICD-10-CM | POA: Diagnosis not present

## 2019-01-20 LAB — COMPREHENSIVE METABOLIC PANEL
ALT: 12 U/L (ref 0–44)
AST: 18 U/L (ref 15–41)
Albumin: 4.8 g/dL (ref 3.5–5.0)
Alkaline Phosphatase: 37 U/L — ABNORMAL LOW (ref 38–126)
Anion gap: 12 (ref 5–15)
BUN: 15 mg/dL (ref 6–20)
CO2: 24 mmol/L (ref 22–32)
Calcium: 9.5 mg/dL (ref 8.9–10.3)
Chloride: 101 mmol/L (ref 98–111)
Creatinine, Ser: 1.18 mg/dL (ref 0.61–1.24)
GFR calc Af Amer: >60 mL/min (ref >60)
GFR calc non Af Amer: >60 mL/min (ref >60)
Glucose, Bld: 112 mg/dL — ABNORMAL HIGH (ref 70–99)
Potassium: 3.6 mmol/L (ref 3.5–5.1)
Sodium: 137 mmol/L (ref 135–145)
Total Bilirubin: 0.9 mg/dL (ref 0.3–1.2)
Total Protein: 8.5 g/dL — ABNORMAL HIGH (ref 6.5–8.1)

## 2019-01-20 LAB — LIPASE, BLOOD: Lipase: 23 U/L (ref 11–51)

## 2019-01-20 LAB — CBC
HCT: 48.9 % (ref 39.0–52.0)
Hemoglobin: 16.1 g/dL (ref 13.0–17.0)
MCH: 30.8 pg (ref 26.0–34.0)
MCHC: 32.9 g/dL (ref 30.0–36.0)
MCV: 93.5 fL (ref 80.0–100.0)
Platelets: 282 10*3/uL (ref 150–400)
RBC: 5.23 MIL/uL (ref 4.22–5.81)
RDW: 12.2 % (ref 11.5–15.5)
WBC: 8.6 10*3/uL (ref 4.0–10.5)
nRBC: 0 % (ref 0.0–0.2)

## 2019-01-20 LAB — TROPONIN I (HIGH SENSITIVITY)
Troponin I (High Sensitivity): <2 ng/L (ref <18)
Troponin I (High Sensitivity): 3 ng/L (ref <18)

## 2019-01-20 LAB — FIBRIN DERIVATIVES D-DIMER (ARMC ONLY): Fibrin derivatives D-dimer (ARMC): 127.31 ng/mL (FEU) (ref 0.00–499.00)

## 2019-01-20 MED ORDER — FAMOTIDINE 20 MG PO TABS: 20.0000 mg | ORAL_TABLET | Freq: Two times a day (BID) | ORAL | 0 refills | Status: DC

## 2019-01-20 MED ORDER — SUCRALFATE 1 GM/10ML PO SUSP: 1.0000 g | Freq: Four times a day (QID) | ORAL | 1 refills | Status: DC

## 2019-01-20 MED ORDER — FAMOTIDINE IN NACL 20-0.9 MG/50ML-% IV SOLN
20.0000 mg | Freq: Once | INTRAVENOUS | Status: AC
  Administered 2019-01-20: 20 mg via INTRAVENOUS
  Filled 2019-01-20: qty 50

## 2019-01-20 NOTE — ED Triage Notes (Signed)
Pt arrived from home via ems with c/o chest discomfort; pt thought it was acid reflux at first but became dizzy walking down his stairs at home; pt was seen by his provider this past week-had blood work done, a chest xray and EKG; was told there was nothing alarming on the EKG but there were some slight changes from the last one they compared it to; pt arrives pain free after getting oxygen in route in the ambulance; pt awake and alert; talking in complete coherent sentences;

## 2019-01-20 NOTE — Discharge Instructions (Signed)
Start Pepcid 20mg  twice daily (#60). Take Carafate as directed. Return to the ER for worsening symptoms, persistent vomiting, difficulty breathing or other concerns.

## 2019-01-20 NOTE — ED Provider Notes (Signed)
[facility] Emergency Department Provider Note   ____________________________________________   First MD Initiated Contact with Patient 01/20/19 [phone]     (approximate)  I have reviewed the triage vital signs and the nursing notes.   HISTORY  Chief Complaint Chest Pain    HPI John Snow is a 33 y.o. male brought to the ED from home via EMS with a chief complaint of chest pain.  Patient has been experiencing left lower chest/left upper quadrant pain for the past week.  Seen by his PCP with unremarkable EKG and troponin drawn.  He has been anxious awaiting troponin results.  Pain sometimes exacerbated by eating.  Had another twinge of pain tonight and decided to come to the ED.  Admits he has heightened anxiety about his health.  States when he was 33 years old he was told he had a heart attack in the setting of viral illness and had a cardiac cath which was negative.  He was followed by cardiology for several years before they released him.  Also has been followed by GI in the past with symptoms improved on PPI which he only took for 1 year.  Denies recent fever, shortness of breath, abdominal pain, diaphoresis, nausea, vomiting, palpitations, dizziness.  Denies recent travel, trauma or hormone use.       Past Medical History:  Diagnosis Date  . GERD (gastroesophageal reflux disease)   . Heart attack [facility]) 2007    Patient Active Problem List   Diagnosis Date Noted  . Routine general medical examination at a health care facility 09/25/2018  . Groin pain 01/14/2018  . Acute right-sided thoracic back pain 11/13/2017  . Acute lumbar back pain 11/13/2017  . Alopecia 10/01/2017  . Hyperpigmented skin lesion 07/01/2017  . Plantar wart of left foot 07/01/2017  . GERD (gastroesophageal reflux disease) 04/26/2017  . Hypersomnia 12/15/2016  . Allergic rhinitis 12/27/2015  . Shift work sleep disorder 12/27/2015  . Family history of prostate cancer in father  12/27/2015  . Palpitations 07/26/2015  . History of MI (myocardial infarction) 05/28/2015  . Obesity 05/28/2015    History reviewed. No pertinent surgical history.  Prior to Admission medications   Medication Sig Start Date End Date Taking? Authorizing Provider  cholecalciferol (VITAMIN D) 1000 units tablet Take 1,000 Units by mouth daily.    [provider]  famotidine (PEPCID) 20 MG tablet Take 1 tablet (20 mg total) by mouth 2 (two) times daily. 01/20/19   Paulette Blanch, MD  Flaxseed, Linseed, (FLAX SEED OIL) 1000 MG CAPS Take by mouth.    [provider]  Multiple Vitamin (MULTIVITAMIN WITH MINERALS) TABS tablet Take 1 tablet by mouth daily.    [provider]  sucralfate (CARAFATE) 1 GM/10ML suspension Take 10 mLs (1 g total) by mouth 4 (four) times daily. 01/20/19   Paulette Blanch, MD    Allergies Patient has no known allergies.  Family History  Problem Relation Age of Onset  . Cancer Father 25       Not prostate cancer  . Hypertension Maternal Grandmother     Social History Social History   Tobacco Use  . Smoking status: Never Smoker  . Smokeless tobacco: Never Used  Substance Use Topics  . Alcohol use: Yes    Alcohol/week: 0.0 standard drinks    Comment: occasional  . Drug use: No    Review of Systems  Constitutional: No fever/chills Eyes: No visual changes. ENT: No sore throat. Cardiovascular: Positive for  chest pain. Respiratory: Denies shortness of breath. Gastrointestinal: No abdominal pain.  No nausea, no vomiting.  No diarrhea.  No constipation. Genitourinary: Negative for dysuria. Musculoskeletal: Negative for back pain. Skin: Negative for rash. Neurological: Negative for headaches, focal weakness or numbness.   ____________________________________________   PHYSICAL EXAM:  VITAL SIGNS: ED Triage Vitals  Enc Vitals Group     BP 01/20/19 0135 127/75     Pulse Rate 01/20/19 0135 87     Resp 01/20/19 0135 17     Temp  01/20/19 0135 99.1 F (37.3 C)     Temp Source 01/20/19 0135 Oral     SpO2 01/20/19 0135 100 %     Weight 01/20/19 0136 212 lb (96.2 kg)     Height 01/20/19 0136 5\' 10"  (1.778 m)     Head Circumference --      Peak Flow --      Pain Score 01/20/19 0136 0     Pain Loc --      Pain Edu? --      Excl. in [place]? --     Constitutional: Alert and oriented. Well appearing and in no acute distress. Eyes: Conjunctivae are normal. PERRL. EOMI. Head: Atraumatic. Nose: No congestion/rhinnorhea. Mouth/Throat: Mucous membranes are moist.  Oropharynx non-erythematous. Neck: No stridor.   Cardiovascular: Normal rate, regular rhythm. Grossly normal heart sounds.  Good peripheral circulation. Respiratory: Normal respiratory effort.  No retractions. Lungs CTAB. Gastrointestinal: Soft and nontender to light or deep palpation. No distention. No abdominal bruits. No CVA tenderness. Musculoskeletal: No lower extremity tenderness nor edema.  No joint effusions. Neurologic:  Normal speech and language. No gross focal neurologic deficits are appreciated. No gait instability. Skin:  Skin is warm, dry and intact. No rash noted. Psychiatric: Mood and affect are normal. Speech and behavior are normal.  ____________________________________________   LABS (all labs ordered are listed, but only abnormal results are displayed)  Labs Reviewed  COMPREHENSIVE METABOLIC PANEL - Abnormal; Notable for the following components:      Result Value   Glucose, Bld 112 (*)    Total Protein 8.5 (*)    Alkaline Phosphatase 37 (*)    All other components within normal limits  CBC  LIPASE, BLOOD  FIBRIN DERIVATIVES D-DIMER ([facility] ONLY)  TROPONIN I (HIGH SENSITIVITY)  TROPONIN I (HIGH SENSITIVITY)   ____________________________________________  EKG  ED ECG REPORT I, Verdelle Valtierra J, the attending physician, personally viewed and interpreted this ECG.   Date: 01/20/2019  EKG Time: 0130  Rate: 95  Rhythm: normal EKG,  normal sinus rhythm  Axis: Normal  Intervals:none  ST&T Change: Nonspecific  ____________________________________________  RADIOLOGY  ED MD interpretation: No acute cardiopulmonary process; unremarkable ultrasound  Official radiology report(s): Dg Chest 2 View  Result Date: 01/20/2019 CLINICAL DATA:  33 year old male with chest pain and dizziness. EXAM: CHEST - 2 VIEW COMPARISON:  [facility] chest radiographs 01/17/2019, [facility] chest radiographs 06/03/2016. FINDINGS: Lung volumes and mediastinal contours remain normal. Visualized tracheal air column is within normal limits. No pneumothorax, pulmonary edema, pleural effusion or confluent pulmonary opacity. Lung markings appear stable since 2018. Negative visible bowel gas and osseous structures. IMPRESSION: No acute cardiopulmonary abnormality. Electronically Signed   By: Genevie Ann M.D.   On: 01/20/2019 01:59   US Abdomen Limited Ruq  Result Date: 01/20/2019 CLINICAL DATA:  Upper abdominal pain EXAM: ULTRASOUND ABDOMEN LIMITED RIGHT UPPER QUADRANT COMPARISON:  None. FINDINGS: Gallbladder: No gallstones or wall thickening visualized. No sonographic Murphy sign  noted by sonographer. Common bile duct: Diameter: 2 mm Liver: No focal lesion identified. Within normal limits in parenchymal echogenicity. Portal vein is patent on color Doppler imaging with normal direction of blood flow towards the liver. IMPRESSION: Normal right upper quadrant ultrasound. Electronically Signed   By: Monte Fantasia M.D.   On: 01/20/2019 04:22    ____________________________________________   PROCEDURES  Procedure(s) performed (including Critical Care):  Procedures   ____________________________________________   INITIAL IMPRESSION / ASSESSMENT AND PLAN / ED COURSE  As part of my medical decision making, I reviewed the following data within the [employer] History obtained from family, Nursing notes reviewed  and incorporated, Labs reviewed, EKG interpreted, Old EKG reviewed, Old chart reviewed, Radiograph reviewed and Notes from prior ED visits     Carrie Souva was evaluated in Emergency Department on 01/20/2019 for the symptoms described in the history of present illness. He was evaluated in the context of the global COVID-19 pandemic, which necessitated consideration that the patient might be at risk for infection with the SARS-CoV-2 virus that causes COVID-19. Institutional protocols and algorithms that pertain to the evaluation of patients at risk for COVID-19 are in a state of rapid change based on information released by regulatory bodies including the CDC and federal and state organizations. These policies and algorithms were followed during the patient's care in the ED.    33 year old male who presents with chest pain/left upper quadrant abdominal pain x1 week. Differential diagnosis includes, but is not limited to, ACS, aortic dissection, pulmonary embolism, cardiac tamponade, pneumothorax, pneumonia, pericarditis, myocarditis, GI-related causes including esophagitis/gastritis, and musculoskeletal chest wall pain.    Initial troponin unremarkable.  Will repeat troponin, check lipase, obtain right upper quadrant abdominal ultrasound.  Administer IV Pepcid and reassess.   Clinical Course as of Jan 20 539  Mon Jan 20, 2019  [id number] Updated patient and his mother on repeat troponin, D-dimer and lipase results.  Will discharge home on Pepcid, Carafate and patient will follow-up with his PCP next week.  Strict return precautions given.  Both verbalized understanding and agree with plan of care.   [JS]    Clinical Course User Index [JS] Paulette Blanch, MD     ____________________________________________   FINAL CLINICAL IMPRESSION(S) / ED DIAGNOSES  Final diagnoses:  Nonspecific chest pain  Gastroesophageal reflux disease, unspecified whether esophagitis present     ED Discharge Orders          Ordered    famotidine (PEPCID) 20 MG tablet  2 times daily     01/20/19 0538    sucralfate (CARAFATE) 1 GM/10ML suspension  4 times daily     01/20/19 [id number]           Note:  This document was prepared using Dragon voice recognition software and may include unintentional dictation errors.   Paulette Blanch, MD 01/20/19 [phone]

## 2019-01-21 ENCOUNTER — Encounter: Payer: Self-pay | Admitting: Cardiovascular Disease

## 2019-01-21 ENCOUNTER — Ambulatory Visit (INDEPENDENT_AMBULATORY_CARE_PROVIDER_SITE_OTHER): Payer: BC Managed Care – PPO | Admitting: Cardiovascular Disease

## 2019-01-21 ENCOUNTER — Encounter: Payer: Self-pay | Admitting: Family Medicine

## 2019-01-21 VITALS — BP 110/74 | HR 80 | Ht 70.0 in | Wt 211.0 lb

## 2019-01-21 DIAGNOSIS — R0789 Other chest pain: Secondary | ICD-10-CM | POA: Diagnosis not present

## 2019-01-21 DIAGNOSIS — R9431 Abnormal electrocardiogram [ECG] [EKG]: Secondary | ICD-10-CM | POA: Diagnosis not present

## 2019-01-21 DIAGNOSIS — R1013 Epigastric pain: Secondary | ICD-10-CM

## 2019-01-21 DIAGNOSIS — R12 Heartburn: Secondary | ICD-10-CM

## 2019-01-21 MED ORDER — SUCRALFATE 1 G PO TABS: 1.0000 g | ORAL_TABLET | Freq: Four times a day (QID) | ORAL | 0 refills | Status: DC

## 2019-01-21 NOTE — Progress Notes (Signed)
Cardiology Office Note   Date:  01/21/2019   ID:  John Snow, DOB Jul 06, 1985, MRN [id number]  PCP:  Leone Haven, MD  Cardiologist:   Kathlyn Sacramento, MD   Chief Complaint  Patient presents with  . Other    referred by PCP for Chest pain. Meds reviewed verbally with patient.       History of Present Illness: John Snow is a 33 y.o. male who was referred by Lysle Morales for evaluation of chest pain. He was seen by Dr. Yvone Neu in 2017 for palpitations.  Echocardiogram showed normal LV systolic function and no significant valvular abnormalities.  His symptoms resolved without intervention. He reports that he was suspected of having a heart attack at age 57 based on his EKG but was taken to [facility] where he underwent cardiac catheterization which showed normal coronary arteries.  Since then, he has been anxious about a possible cardiac etiology for his symptoms.  He has no history of hypertension, diabetes, hyperlipidemia or tobacco use.  There is no family history of premature coronary artery disease.  He works as a [name] at [employer]. He tries to stay active and occasionally exercise with no exertional symptoms. He had recent episodes of brief substernal chest pain described as sharp and occasionally tightness feeling which lasted for only few seconds.  This happens at rest and not with exertion. He was seen by his primary care physician's office and had a an EKG done which was read as an abnormal.  However, I looked at the EKG and I suspect that there was precordial lead misplacement . The patient became anxious especially with his previous reported history at age 49.  He went to the emergency room yesterday with chest pain.  High-sensitivity troponin was normal.  D-dimer was normal.  His labs were unremarkable and abdominal ultrasound was normal.  Past Medical History:  Diagnosis Date  . GERD (gastroesophageal reflux disease)   . Heart attack [facility]) 2007     History reviewed. No pertinent surgical history.   Current Outpatient Medications  Medication Sig Dispense Refill  . cholecalciferol (VITAMIN D) 1000 units tablet Take 1,000 Units by mouth daily.    . famotidine (PEPCID) 20 MG tablet Take 1 tablet (20 mg total) by mouth 2 (two) times daily. 60 tablet 0  . Flaxseed, Linseed, (FLAX SEED OIL) 1000 MG CAPS Take by mouth.    . Multiple Vitamin (MULTIVITAMIN WITH MINERALS) TABS tablet Take 1 tablet by mouth daily.    . sucralfate (CARAFATE) 1 g tablet Take 1 tablet (1 g total) by mouth 4 (four) times daily. Make slurry by dissolving pill in small amount of water, then drink. 120 tablet 0   No current facility-administered medications for this visit.     Allergies:   Patient has no known allergies.    Social History:  The patient  reports that he has never smoked. He has never used smokeless tobacco. He reports current alcohol use. He reports that he does not use drugs.   Family History:  The patient's family history includes Cancer (age of onset: 51) in his father; Hypertension in his maternal grandmother.    ROS:  Please see the history of present illness.   Otherwise, review of systems are positive for none.   All other systems are reviewed and negative.    PHYSICAL EXAM: VS:  BP 110/74 (BP Location: Left Arm, Patient Position: Sitting, Cuff Size: Normal)   Pulse 80   Ht 5\' 10"  (  1.778 m)   Wt 211 lb (95.7 kg)   BMI 30.28 kg/m  , BMI Body mass index is 30.28 kg/m. GEN: Well nourished, well developed, in no acute distress  HEENT: normal  Neck: no JVD, carotid bruits, or masses Cardiac: RRR; no murmurs, rubs, or gallops,no edema  Respiratory:  clear to auscultation bilaterally, normal work of breathing GI: soft, nontender, nondistended, + BS MS: no deformity or atrophy  Skin: warm and dry, no rash Neuro:  Strength and sensation are intact Psych: euthymic mood, full affect   EKG:  EKG is ordered today. The ekg ordered today  demonstrates normal sinus rhythm with sinus arrhythmia.  Minor J-point elevation consistent with early repolarization   Recent Labs: 11/12/2018: Magnesium 1.9 01/20/2019: ALT 12; BUN 15; Creatinine, Ser 1.18; Hemoglobin 16.1; Platelets 282; Potassium 3.6; Sodium 137    Lipid Panel    Component Value Date/Time   CHOL 193 09/25/2018 1409   TRIG 55.0 09/25/2018 1409   HDL 68.80 09/25/2018 1409   CHOLHDL 3 09/25/2018 1409   VLDL 11.0 09/25/2018 1409   LDLCALC 114 (H) 09/25/2018 1409      Wt Readings from Last 3 Encounters:  01/21/19 211 lb (95.7 kg)  01/20/19 212 lb (96.2 kg)  01/17/19 213 lb (96.6 kg)        No flowsheet data found.    ASSESSMENT AND PLAN:  1.  Noncardiac chest pain: The patient is having chest pain lasting for few seconds at rest not consistent with a cardiac etiology.  He has no exertional symptoms and no associated shortness of breath.  His EKG does not show any ischemic changes and recent high-sensitivity troponin was normal.  He does have some early repolarization in his EKG which might give an impression of an abnormal EKG. I explained to him that he did not have a heart attack at age 52 based on his description of the events and the fact that his cardiac catheterization showed normal coronary arteries.  Also an echocardiogram in 2017 showed normal LV systolic function with no wall motion abnormalities. I do not think he requires further ischemic cardiac work-up at the present time especially with minimal risk factors. I discussed with the patient the importance of lifestyle changes in order to decrease the chance of future coronary artery disease and cardiovascular events. We discussed the importance of controlling risk factors, healthy diet as well as regular exercise.   He can follow-up with me as needed.    Signed,  Kathlyn Sacramento, MD  01/21/2019 4:45 PM    [facility]

## 2019-01-21 NOTE — Telephone Encounter (Signed)
Tablet form sent in instead

## 2019-01-21 NOTE — Patient Instructions (Signed)
Medication Instructions:  Your physician recommends that you continue on your current medications as directed. Please refer to the Current Medication list given to you today.  *If you need a refill on your cardiac medications before your next appointment, please call your pharmacy*  Lab Work: None ordered If you have labs (blood work) drawn today and your tests are completely normal, you will receive your results only by: Marland Kitchen MyChart Message (if you have MyChart) OR . A paper copy in the mail If you have any lab test that is abnormal or we need to change your treatment, we will call you to review the results.  Testing/Procedures: None ordered  Follow-Up: At [facility], you and your health needs are our priority.  As part of our continuing mission to provide you with exceptional heart care, we have created designated Provider Care Teams.  These Care Teams include your primary Cardiologist (physician) and Advanced Practice Providers (APPs -  Physician Assistants and Nurse Practitioners) who all work together to provide you with the care you need, when you need it.  Your next appointment:    As neeed  The format for your next appointment:   In Person  Provider:    You may see  Dr. Fletcher Anon  or one of the following Advanced Practice Providers on your designated Care Team:    Murray Hodgkins, NP  Christell Faith, PA-C  Marrianne Mood, PA-C   Other Instructions N/A

## 2019-07-08 ENCOUNTER — Encounter: Payer: Self-pay | Admitting: Physician Assistant

## 2019-07-08 ENCOUNTER — Ambulatory Visit (INDEPENDENT_AMBULATORY_CARE_PROVIDER_SITE_OTHER): Payer: BC Managed Care – PPO

## 2019-07-08 ENCOUNTER — Other Ambulatory Visit: Payer: Self-pay

## 2019-07-08 ENCOUNTER — Ambulatory Visit (INDEPENDENT_AMBULATORY_CARE_PROVIDER_SITE_OTHER): Payer: BC Managed Care – PPO | Admitting: Physician Assistant

## 2019-07-08 VITALS — BP 126/72 | HR 96 | Ht 70.0 in | Wt 205.4 lb

## 2019-07-08 DIAGNOSIS — K219 Gastro-esophageal reflux disease without esophagitis: Secondary | ICD-10-CM | POA: Diagnosis not present

## 2019-07-08 DIAGNOSIS — R002 Palpitations: Secondary | ICD-10-CM

## 2019-07-08 DIAGNOSIS — I493 Ventricular premature depolarization: Secondary | ICD-10-CM

## 2019-07-08 DIAGNOSIS — G4726 Circadian rhythm sleep disorder, shift work type: Secondary | ICD-10-CM | POA: Diagnosis not present

## 2019-07-08 DIAGNOSIS — R0789 Other chest pain: Secondary | ICD-10-CM | POA: Diagnosis not present

## 2019-07-08 DIAGNOSIS — Z8679 Personal history of other diseases of the circulatory system: Secondary | ICD-10-CM

## 2019-07-08 DIAGNOSIS — E7800 Pure hypercholesterolemia, unspecified: Secondary | ICD-10-CM

## 2019-07-08 NOTE — Progress Notes (Signed)
Office Visit    Patient Name: John Snow Date of Encounter: 07/08/2019  Primary Care Provider:  Leone Haven, MD Primary Cardiologist:  Kathlyn Sacramento, MD  Chief Complaint    Chief Complaint  Patient presents with  . office visit    Pt having some concerns w/ irregular heart beat and tightness in chest. Meds verbally reviewed w/ pt.   34 year old male with history of cardiac cath showing normal cors 2007 and who presents today for clarification regarding his PVCs and EKG with repolarization abnormalities.  Past Medical History    Past Medical History:  Diagnosis Date  . GERD (gastroesophageal reflux disease)   . Heart attack [facility]) 2007   History reviewed. No pertinent surgical history.  Allergies  No Known Allergies  History of Present Illness    John Snow is a 34 y.o. male with PMH as above.  He currently works as a [name] for [employer].  He has no known history of hypertension, DM2, hyperlipidemia, or tobacco use.  He denies a family history of premature CAD.  He recently lost a significant amount of weight for lifestyle modification.  He remains active and makes an effort to eat healthy.  At age 4, he reportedly had an abnormal EKG that he was told was a heart attack. Subsequent cardiac catheterization showed normal coronary arteries.  In 2017, he established with [facility] and was seen by Dr. Loletha Grayer for palpitations.  Echo showed normal LV systolic function and no significant valvular abnormalities.  He was offered a cardiac monitor, which was deferred at that time.  In November 2020, he reported episodes of brief substernal chest pain, which were described as sharp and occasionally a feeling of tightness that lasted only a few seconds before resolving on their own.  CP occurred at rest and not with exertion.  He was seen by his PCP with EKG read as abnormal and referred to [facility]. He was seen by Dr. Fletcher Anon with EKG showing early  repolarization and likely precordial lead misplacement.    When last in clinic, he expressed continued anxiety regarding a previous report that he suffered a heart attack at the age of 49.  He was reassured that his previous LHC showed nl cors. His EKG was without acute changes. Moreover, his CP was more consistent with that of noncardiac etiology. He has recently presented to the ED for an episode of CP with subsequent high-sensitivity troponin normal.  No further ischemic workup was recommended. Ongoing lifestyle changes were encouraged.   Today, he presents to clinic for clarification regarding his previous studies, EKGs, and cath. He denies any recent episodes of chest pain. No dyspnea, pnd, orthopnea, n, v, dizziness, syncope, edema, weight gain, or early satiety. He does note continue palpitations or PVCs.   Home Medications    Prior to Admission medications   Medication Sig Start Date End Date Taking? Authorizing Provider  cholecalciferol (VITAMIN D) 1000 units tablet Take 1,000 Units by mouth daily.   Yes [provider]    Review of Systems    No recent episodes of chest pain. No dyspnea, pnd, orthopnea, n, v, dizziness, syncope, edema, weight gain, or early satiety. He does note continue palpitations or PVCs.   All other systems reviewed and are otherwise negative except as noted above.  Physical Exam    VS:  BP 126/72 (BP Location: Left Arm, Patient Position: Sitting, Cuff Size: Normal)   Pulse 96   Ht 5\' 10"  (1.778 m)  Wt 205 lb 6 oz (93.2 kg)   SpO2 100%   BMI 29.47 kg/m  , BMI Body mass index is 29.47 kg/m. GEN: Well nourished, well developed, in no acute distress. HEENT: normal. Neck: Supple, no JVD, carotid bruits, or masses. Cardiac: RRR, no murmurs, rubs, or gallops. No clubbing, cyanosis, edema.  Radials/DP/PT 2+ and equal bilaterally.  Respiratory:  Respirations regular and unlabored, clear to auscultation bilaterally. GI: Soft, nontender, nondistended,  BS + x 4. MS: no deformity or atrophy. Skin: warm and dry, no rash. Neuro:  Strength and sensation are intact. Psych: Normal affect.  Accessory Clinical Findings    ECG personally reviewed by me today -NSR, 96 bpm, sinus arrhythmia, PR interval 130 ms, QRS 82 ms, QTC 419 ms, nonspecific J-point elevation/early repolarization- no acute changes.  VITALS Reviewed today   Temp Readings from Last 3 Encounters:  01/20/19 99.1 F (37.3 C) (Oral)  01/17/19 97.9 F (36.6 C) (Temporal)  11/12/18 (!) 96.3 F (35.7 C) (Temporal)   BP Readings from Last 3 Encounters:  07/08/19 126/72  01/21/19 110/74  01/20/19 120/74   Pulse Readings from Last 3 Encounters:  07/08/19 96  01/21/19 80  01/20/19 67    Wt Readings from Last 3 Encounters:  07/08/19 205 lb 6 oz (93.2 kg)  01/21/19 211 lb (95.7 kg)  01/20/19 212 lb (96.2 kg)     LABS  reviewed today   CareEverwhere Labs present and most recent? Yes/No: No  Lab Results  Component Value Date   WBC 8.6 01/20/2019   HGB 16.1 01/20/2019   HCT 48.9 01/20/2019   MCV 93.5 01/20/2019   PLT 282 01/20/2019   Lab Results  Component Value Date   CREATININE 1.18 01/20/2019   BUN 15 01/20/2019   NA 137 01/20/2019   K 3.6 01/20/2019   CL 101 01/20/2019   CO2 24 01/20/2019   Lab Results  Component Value Date   ALT 12 01/20/2019   AST 18 01/20/2019   ALKPHOS 37 (L) 01/20/2019   BILITOT 0.9 01/20/2019   Lab Results  Component Value Date   CHOL 193 09/25/2018   HDL 68.80 09/25/2018   LDLCALC 114 (H) 09/25/2018   TRIG 55.0 09/25/2018   CHOLHDL 3 09/25/2018    Lab Results  Component Value Date   HGBA1C 5.4 09/25/2018   Lab Results  Component Value Date   TSH 2.65 04/26/2017     STUDIES/PROCEDURES reviewed today   Echo 08/27/2015 - Left ventricle: The cavity size was normal. Systolic function was  normal. The estimated ejection fraction was in the range of 55%  to 60%. Wall motion was normal; there were no regional wall   motion abnormalities. Left ventricular diastolic function  parameters were normal.  - Left atrium: The atrium was normal in size.  - Right ventricle: Systolic function was normal.  - Pulmonary arteries: Systolic pressure was within the normal  range.   Assessment & Plan    Palpitations / PVCs --He continues to note palpitations. Decision to place ZioXT today for 2 weeks to rule out any arrhythmia and quantify his PVCs / ectopy. Further recommendations pending Zio results.   Noncardiac Chest Pain --No recent / current CP. We reviewed typical verus atypical CP. We also discussed his cath findings of nl cors, as well as his echo results with nl EF and NRWMA as directly above. EKG today reviewed together to help assist with explanation of early repolarization verus ischemic changes. Zio placed as above. Ongoing lifestyle  modification recommended, including diet and exercise.   Medication changes: None Labs ordered: None Studies / Imaging ordered: 2 week ZioXT Disposition: RTC 4-6 weeks   Arvil Chaco, PA-C 07/08/2019

## 2019-07-08 NOTE — Patient Instructions (Signed)
Medication Instructions:  Your physician recommends that you continue on your current medications as directed. Please refer to the Current Medication list given to you today.  *If you need a refill on your cardiac medications before your next appointment, please call your pharmacy*   Lab Work: None ordered  If you have labs (blood work) drawn today and your tests are completely normal, you will receive your results only by: [place] MyChart Message (if you have MyChart) OR . A paper copy in the mail If you have any lab test that is abnormal or we need to change your treatment, we will call you to review the results.   Testing/Procedures: 1- A zio monitor was placed today. It will remain on for 14 days. You will then return monitor and event diary in provided box. It takes 1-2 weeks for report to be downloaded and returned to [place]. We will call you with the results. If monitor falls of or has orange flashing light, please call Zio for further instructions.     Follow-Up: At [facility], you and your health needs are our priority.  As part of our continuing mission to provide you with exceptional heart care, we have created designated Provider Care Teams.  These Care Teams include your primary Cardiologist (physician) and Advanced Practice Providers (APPs -  Physician Assistants and Nurse Practitioners) who all work together to provide you with the care you need, when you need it.  We recommend signing up for the patient portal called "MyChart".  Sign up information is provided on this After Visit Summary.  MyChart is used to connect with patients for Virtual Visits (Telemedicine).  Patients are able to view lab/test results, encounter notes, upcoming appointments, etc.  Non-urgent messages can be sent to your provider as well.   To learn more about what you can do with MyChart, go to [email or web address].    Your next appointment:   4-6 week(s)  The format for your next appointment:   In  Person  Provider:     You may see Dr. Fletcher Anon or Marrianne Mood, PA-C    Other Instructions BP should be less than 130/80

## 2019-08-25 ENCOUNTER — Other Ambulatory Visit: Payer: Self-pay

## 2019-08-25 ENCOUNTER — Ambulatory Visit: Payer: BC Managed Care – PPO | Admitting: Physician Assistant

## 2019-08-25 ENCOUNTER — Encounter: Payer: Self-pay | Admitting: Physician Assistant

## 2019-08-25 VITALS — BP 116/62 | HR 86 | Ht 70.0 in | Wt 207.0 lb

## 2019-08-25 DIAGNOSIS — R0789 Other chest pain: Secondary | ICD-10-CM | POA: Diagnosis not present

## 2019-08-25 DIAGNOSIS — R002 Palpitations: Secondary | ICD-10-CM | POA: Diagnosis not present

## 2019-08-25 DIAGNOSIS — G4726 Circadian rhythm sleep disorder, shift work type: Secondary | ICD-10-CM

## 2019-08-25 DIAGNOSIS — I493 Ventricular premature depolarization: Secondary | ICD-10-CM

## 2019-08-25 DIAGNOSIS — E7800 Pure hypercholesterolemia, unspecified: Secondary | ICD-10-CM

## 2019-08-25 NOTE — Progress Notes (Signed)
Office Visit    Patient Name: John Snow Date of Encounter: 08/26/2019  Primary Care Provider:  Leone Haven, MD Primary Cardiologist:  Kathlyn Sacramento, MD  Chief Complaint    Chief Complaint  Patient presents with  . OTHER    4-6 wk f/u no complaints today. Meds reviewed verbally with pt.   34 year old Snow with history of cardiac cath showing normal cors 2007 and who presents today for 1 month follow-up of PVCs and s/p monitor.  Past Medical History    Past Medical History:  Diagnosis Date  . GERD (gastroesophageal reflux disease)   . Heart attack [facility]) 2007   History reviewed. No pertinent surgical history.  Allergies  No Known Allergies  History of Present Illness    John Snow is a 34 y.o. Snow with PMH as above.  He currently works as a [name] for [employer].  He has no known history of hypertension, DM2, hyperlipidemia, or tobacco use.  He denies a family history of premature CAD.  He recently lost a significant amount of weight for lifestyle modification.  He remains active and makes an effort to eat healthy.  At age 24, he reportedly had an abnormal EKG that he was told was a heart attack. Subsequent cardiac catheterization showed normal coronary arteries.  In 2017, he established with [facility] and was seen by Dr. Loletha Grayer for palpitations.  Echo showed normal LV systolic function and no significant valvular abnormalities.  He was offered a cardiac monitor, which was deferred at that time.  In November 2020, he reported episodes of brief substernal chest pain, which were described as sharp and occasionally a feeling of tightness that lasted only a few seconds before resolving on their own.  CP occurred at rest and not with exertion.  He was seen by his PCP with EKG read as abnormal and referred to [facility]. He was seen by Dr. Fletcher Anon with EKG showing early repolarization and likely precordial lead misplacement.    When seen in clinic  01/2019, he expressed continued anxiety regarding a previous report that he suffered a heart attack at the age of 89.  He was reassured that his previous LHC showed nl cors. His EKG was without acute changes. Moreover, his CP was more consistent with that of noncardiac etiology. He has recently presented to the ED for an episode of CP with subsequent high-sensitivity troponin normal.  No further ischemic workup was recommended. Ongoing lifestyle changes were encouraged.   He was seen again 07/2019 for clarification regarding previous studies, EKGs, and his catheterization. He denied any recent episodes of chest pain. No dyspnea, pnd, orthopnea, n, v, dizziness, syncope, edema, weight gain, or early satiety. He did continue to note palpitations or PVCs. Recommendation was for 2 week Zio with results as below.   Today, he returns to clinic and is doing well. He reports a day with more PVCs / palpitations than usual, which concerned him. On review of his Zio today, an episode of CP was also noted since our last visit; however, he states today that this was likely just pressure with racing HR/palpitations, given he cannot recall any CP. He denies dyspnea, pnd, orthopnea, n, v, dizziness, syncope, edema, weight gain, or early satiety. He continues to eat healthy. He does note that his sleeping schedule is significantly influenced by the fact that he works third shift. He wonders if this may be influencing his palpitations and feels they may occur more with less sleep.  Zio monitor reviewed as below.  Home Medications    Prior to Admission medications   Medication Sig Start Date End Date Taking? Authorizing Provider  cholecalciferol (VITAMIN D) 1000 units tablet Take 1,000 Units by mouth daily.   Yes [provider]    Review of Systems    No recent episodes of chest pain (reports today that the Zio log of CP was likely racing HR and palpitations, given he cannot remember any CP). No dyspnea,  pnd, orthopnea, n, v, dizziness, syncope, edema, weight gain, or early satiety. He does note continue palpitations or PVCs.   All other systems reviewed and are otherwise negative except as noted above.  Physical Exam    VS:  BP 116/62 (BP Location: Left Arm, Patient Position: Sitting, Cuff Size: Normal)   Pulse 86   Ht 5\' 10"  (1.778 m)   Wt 207 lb (93.9 kg)   SpO2 98%   BMI 29.70 kg/m  , BMI Body mass index is 29.7 kg/m. GEN: Well nourished, well developed, in no acute distress. HEENT: normal. Neck: Supple, no JVD, carotid bruits, or masses. Cardiac: RRR, no murmurs, rubs, or gallops. No clubbing, cyanosis, edema.  Radials/DP/PT 2+ and equal bilaterally.  Respiratory:  Respirations regular and unlabored, clear to auscultation bilaterally. GI: Soft, nontender, nondistended, BS + x 4. MS: no deformity or atrophy. Skin: warm and dry, no rash. Neuro:  Strength and sensation are intact. Psych: Normal affect.  Accessory Clinical Findings    ECG personally reviewed by me today -NSR, 86 bpm, sinus arrhythmia, PR interval 126 ms, QRS 82 ms, QTC 430 ms, nonspecific J-point elevation/early repolarization- no acute changes.  VITALS Reviewed today   Temp Readings from Last 3 Encounters:  01/20/19 99.1 F (37.3 C) (Oral)  01/17/19 97.9 F (36.6 C) (Temporal)  11/12/18 (!) 96.3 F (35.7 C) (Temporal)   BP Readings from Last 3 Encounters:  08/25/19 116/62  07/08/19 126/72  01/21/19 110/74   Pulse Readings from Last 3 Encounters:  08/25/19 86  07/08/19 96  01/21/19 80    Wt Readings from Last 3 Encounters:  08/25/19 207 lb (93.9 kg)  07/08/19 205 lb 6 oz (93.2 kg)  01/21/19 211 lb (95.7 kg)     LABS  reviewed today   CareEverwhere Labs present and most recent? Yes/No: No  Lab Results  Component Value Date   WBC 8.6 01/20/2019   HGB 16.1 01/20/2019   HCT 48.9 01/20/2019   MCV 93.5 01/20/2019   PLT 282 01/20/2019   Lab Results  Component Value Date   CREATININE 1.18  01/20/2019   BUN 15 01/20/2019   NA 137 01/20/2019   K 3.6 01/20/2019   CL 101 01/20/2019   CO2 24 01/20/2019   Lab Results  Component Value Date   ALT 12 01/20/2019   AST 18 01/20/2019   ALKPHOS 37 (L) 01/20/2019   BILITOT 0.9 01/20/2019   Lab Results  Component Value Date   CHOL 193 09/25/2018   HDL 68.80 09/25/2018   LDLCALC 114 (H) 09/25/2018   TRIG 55.0 09/25/2018   CHOLHDL 3 09/25/2018    Lab Results  Component Value Date   HGBA1C 5.4 09/25/2018   Lab Results  Component Value Date   TSH 2.65 04/26/2017     STUDIES/PROCEDURES reviewed today   Echo 08/27/2015 - Left ventricle: The cavity size was normal. Systolic function was  normal. The estimated ejection fraction was in the range of 55%  to 60%. Wall motion was normal;  there were no regional wall  motion abnormalities. Left ventricular diastolic function  parameters were normal.  - Left atrium: The atrium was normal in size.  - Right ventricle: Systolic function was normal.  - Pulmonary arteries: Systolic pressure was within the normal  range.   Zio  07/2019 Min HR 45bpm, max HR 143bpm, avg HR 74bpm,  NSR predominant rhythm with isolated rare PACs, rare atrial couplets, and rare atrial triplets. Isolated PVCs and ventricular couplets rare. Trigeminy present. Pending MD review  Assessment & Plan    Palpitations / PVCs --He continues to note palpitations.  ZioXT resulted as above with NSR and rare ectopy. Discussed possible BB versus diltiazem for symptom control. Given no recent echo on file, will obtain echo to reassess EF before starting on diltiazem, as well as to ensure patient peace of mind. Further recommendations pending echo.   Noncardiac Chest Pain --No recent / current CP. Previous cath with nl cors, as well as previous echo results with nl EF and NRWMA as directly above. Zio findings as above. Will obtain echo for further peace of mind and given he may start on diltiazem (to ensure nl  EF). Ongoing lifestyle modification recommended, including sleep, diet, and exercise.   Medication changes: None Labs ordered: None Studies / Imaging ordered: Echo  Future considerations: Diltiazem for sx control (he is also thinking about this medication.  Disposition: RTC s/p echo   Arvil Chaco, PA-C 08/26/2019

## 2019-08-25 NOTE — Patient Instructions (Signed)
Medication Instructions:  Your physician recommends that you continue on your current medications as directed. Please refer to the Current Medication list given to you today.  *If you need a refill on your cardiac medications before your next appointment, please call your pharmacy*   Lab Work: None ordered  If you have labs (blood work) drawn today and your tests are completely normal, you will receive your results only by:  [place] (if you have MyChart) OR  A paper copy in the mail If you have any lab test that is abnormal or we need to change your treatment, we will call you to review the results.   Testing/Procedures: 1- Echo  Please return to [facility] on ______________ at _______________ AM/PM for an Echocardiogram. Your physician has requested that you have an echocardiogram. Echocardiography is a painless test that uses sound waves to create images of your heart. It provides your doctor with information about the size and shape of your heart and how well your hearts chambers and valves are working. This procedure takes approximately one hour. There are no restrictions for this procedure. Please note; depending on visual quality an IV may need to be placed.     Follow-Up: At [facility], you and your health needs are our priority.  As part of our continuing mission to provide you with exceptional heart care, we have created designated Provider Care Teams.  These Care Teams include your primary Cardiologist (physician) and Advanced Practice Providers (APPs -  Physician Assistants and Nurse Practitioners) who all work together to provide you with the care you need, when you need it.  We recommend signing up for the patient portal called "MyChart".  Sign up information is provided on this After Visit Summary.  MyChart is used to connect with patients for Virtual Visits (Telemedicine).  Patients are able to view lab/test results, encounter notes, upcoming  appointments, etc.  Non-urgent messages can be sent to your provider as well.   To learn more about what you can do with MyChart, go to [email or web address].    Your next appointment:   6-8 week(s)  The format for your next appointment:   In Person  Provider:     You may see Kathlyn Sacramento, MD or Marrianne Mood, PA-C    Other Instructions Think and talk with your loved ones about taking diltiazem for heart rate control and palpitations. We would start you on a low dose once daily. Call if you have further questions or would like to start.

## 2019-09-24 ENCOUNTER — Other Ambulatory Visit: Payer: Self-pay

## 2019-09-24 ENCOUNTER — Ambulatory Visit (INDEPENDENT_AMBULATORY_CARE_PROVIDER_SITE_OTHER): Payer: BC Managed Care – PPO

## 2019-09-24 DIAGNOSIS — I493 Ventricular premature depolarization: Secondary | ICD-10-CM

## 2019-09-24 LAB — ECHOCARDIOGRAM COMPLETE
AR max vel: 3.6 cm2
AV Area VTI: 3.48 cm2
AV Area mean vel: 3.51 cm2
AV Mean grad: 4 mmHg
AV Peak grad: 6.9 mmHg
Ao pk vel: 1.31 m/s
Area-P 1/2: 4.26 cm2
Calc EF: 52.8 %
S' Lateral: 3.7 cm
Single Plane A2C EF: 53.4 %
Single Plane A4C EF: 54.7 %

## 2019-10-16 ENCOUNTER — Ambulatory Visit (INDEPENDENT_AMBULATORY_CARE_PROVIDER_SITE_OTHER): Payer: BC Managed Care – PPO | Admitting: Physician Assistant

## 2019-10-16 ENCOUNTER — Other Ambulatory Visit: Payer: Self-pay

## 2019-10-16 ENCOUNTER — Encounter: Payer: Self-pay | Admitting: Physician Assistant

## 2019-10-16 VITALS — BP 100/78 | HR 72 | Ht 70.0 in | Wt 213.5 lb

## 2019-10-16 DIAGNOSIS — I493 Ventricular premature depolarization: Secondary | ICD-10-CM

## 2019-10-16 DIAGNOSIS — E7800 Pure hypercholesterolemia, unspecified: Secondary | ICD-10-CM | POA: Diagnosis not present

## 2019-10-16 DIAGNOSIS — G4726 Circadian rhythm sleep disorder, shift work type: Secondary | ICD-10-CM | POA: Diagnosis not present

## 2019-10-16 DIAGNOSIS — R002 Palpitations: Secondary | ICD-10-CM

## 2019-10-16 NOTE — Patient Instructions (Signed)
Medication Instructions:  °Your physician recommends that you continue on your current medications as directed. Please refer to the Current Medication list given to you today. ° °*If you need a refill on your cardiac medications before your next appointment, please call your pharmacy* ° ° °Lab Work: °None ordered  °If you have labs (blood work) drawn today and your tests are completely normal, you will receive your results only by: °• MyChart Message (if you have MyChart) OR °• A paper copy in the mail °If you have any lab test that is abnormal or we need to change your treatment, we will call you to review the results. ° ° °Testing/Procedures: °None ordered  ° ° °Follow-Up: °At [facility], you and your health needs are our priority.  As part of our continuing mission to provide you with exceptional heart care, we have created designated Provider Care Teams.  These Care Teams include your primary Cardiologist (physician) and Advanced Practice Providers (APPs -  Physician Assistants and Nurse Practitioners) who all work together to provide you with the care you need, when you need it. ° °We recommend signing up for the patient portal called "MyChart".  Sign up information is provided on this After Visit Summary.  MyChart is used to connect with patients for Virtual Visits (Telemedicine).  Patients are able to view lab/test results, encounter notes, upcoming appointments, etc.  Non-urgent messages can be sent to your provider as well.   °To learn more about what you can do with MyChart, go to [email or web address].   ° °Your next appointment:   °As needed °

## 2019-10-16 NOTE — Progress Notes (Signed)
Office Visit    Patient Name: Ram Haugan Date of Encounter: 10/16/2019  Primary Care Provider:  Leone Haven, MD Primary Cardiologist:  Kathlyn Sacramento, MD  Chief Complaint    Chief Complaint  Patient presents with  . office visit    F/U after echo; Meds verbally reviewed with patient.   34 year old male with history of cardiac cath showing normal cors 2007 and who presents today for follow-up after echo.  Past Medical History    Past Medical History:  Diagnosis Date  . GERD (gastroesophageal reflux disease)   . Heart attack [facility]) 2007   History reviewed. No pertinent surgical history.  Allergies  No Known Allergies  History of Present Illness    Clemens Treece is a 34 y.o. male with PMH as above.  He currently works as a [name] for [employer].  He has no known history of hypertension, DM2, hyperlipidemia, or tobacco use.  He denies a family history of premature CAD.  He recently lost a significant amount of weight for lifestyle modification.  He remains active and makes an effort to eat healthy.  At age 44, he reportedly had an abnormal EKG that he was told was a heart attack. Subsequent cardiac catheterization showed normal coronary arteries.  In 2017, he established with [facility] and was seen by Dr. Loletha Grayer for palpitations.  Echo showed normal LV systolic function and no significant valvular abnormalities.  He was offered a cardiac monitor, which was deferred at that time.  In November 2020, he reported episodes of brief substernal chest pain, which were described as sharp and occasionally a feeling of tightness that lasted only a few seconds before resolving on their own.  CP occurred at rest and not with exertion.  He was seen by his PCP with EKG read as abnormal and referred to [facility]. He was seen by Dr. Fletcher Anon with EKG showing early repolarization and likely precordial lead misplacement.    When seen in clinic 01/2019, he expressed  continued anxiety regarding a previous report that he suffered a heart attack at the age of 70.  He was reassured that his previous LHC showed nl cors. His EKG was without acute changes. Moreover, his CP was more consistent with that of noncardiac etiology. He has recently presented to the ED for an episode of CP with subsequent high-sensitivity troponin normal.  No further ischemic workup was recommended. Ongoing lifestyle changes were encouraged.   He was seen again 07/2019 for clarification regarding previous studies, EKGs, and his catheterization. He denied any recent episodes of chest pain. No dyspnea, pnd, orthopnea, n, v, dizziness, syncope, edema, weight gain, or early satiety. He did continue to note palpitations or PVCs. Recommendation was for 2 week Zio with results as below.   When last seen in clinic, he reported a day with more PVCs / palpitations than usual, which concerned him. He also noted CP on Zio diary that he could not recall but felt likely just to describe pressure due to racing HR/palpitations. He did note that his sleeping schedule was significantly influenced by the fact that he worked third shift and thought he may have more PVCs on days with less sleep.   Zio monitor reviewed as below.  Today, he RTC and is doing well. He has been walking around neighborhood, reporting he will walk at least a mile. He denies chest pain, dyspnea, pnd, orthopnea, n, v, dizziness, syncope, edema, weight gain, or early satiety. He reports ongoing intermittent palpitations,  worse on some days than others. He has noted gradual improvement in palpitations with further cardiac workup and feels the peace of mind is greatly assisting in alleviating his sx.  Home Medications    Prior to Admission medications   Medication Sig Start Date End Date Taking? Authorizing Provider  cholecalciferol (VITAMIN D) 1000 units tablet Take 1,000 Units by mouth daily.   Yes [provider]    Review of  Systems    He denies chest pain, dyspnea, pnd, orthopnea, n, v, dizziness, syncope, edema, weight gain, or early satiety.  Improving intermittent palpitations or PVCs, worse on some days over others.   All other systems reviewed and are otherwise negative except as noted above.  Physical Exam    VS:  BP 100/78 (BP Location: Left Arm, Patient Position: Sitting, Cuff Size: Normal)   Pulse 72   Ht 5\' 10"  (1.778 m)   Wt 213 lb 8 oz (96.8 kg)   SpO2 98%   BMI 30.63 kg/m  , BMI Body mass index is 30.63 kg/m. GEN: Well nourished, well developed, in no acute distress. HEENT: normal. Neck: Supple, no JVD, carotid bruits, or masses. Cardiac: RRR, no murmurs, rubs, or gallops. No clubbing, cyanosis, edema.  Radials/DP/PT 2+ and equal bilaterally.  Respiratory:  Respirations regular and unlabored, clear to auscultation bilaterally. GI: Soft, nontender, nondistended, BS + x 4. MS: no deformity or atrophy. Skin: warm and dry, no rash. Neuro:  Strength and sensation are intact. Psych: Normal affect.  Accessory Clinical Findings    ECG personally reviewed by me today -NSR, 72 bpm, sinus arrhythmia, PR interval 134 ms, QRS 82 ms, QTC 377ms, nonspecific ongoing J-point elevation/early repolarization and as confirmed by DOD- no acute changes.  VITALS Reviewed today   Temp Readings from Last 3 Encounters:  01/20/19 99.1 F (37.3 C) (Oral)  01/17/19 97.9 F (36.6 C) (Temporal)  11/12/18 (!) 96.3 F (35.7 C) (Temporal)   BP Readings from Last 3 Encounters:  10/16/19 100/78  08/25/19 116/62  07/08/19 126/72   Pulse Readings from Last 3 Encounters:  10/16/19 72  08/25/19 86  07/08/19 96    Wt Readings from Last 3 Encounters:  10/16/19 213 lb 8 oz (96.8 kg)  08/25/19 207 lb (93.9 kg)  07/08/19 205 lb 6 oz (93.2 kg)     LABS  reviewed today   CareEverwhere Labs present and most recent? Yes/No: No  Lab Results  Component Value Date   WBC 8.6 01/20/2019   HGB 16.1 01/20/2019   HCT  48.9 01/20/2019   MCV 93.5 01/20/2019   PLT 282 01/20/2019   Lab Results  Component Value Date   CREATININE 1.18 01/20/2019   BUN 15 01/20/2019   NA 137 01/20/2019   K 3.6 01/20/2019   CL 101 01/20/2019   CO2 24 01/20/2019   Lab Results  Component Value Date   ALT 12 01/20/2019   AST 18 01/20/2019   ALKPHOS 37 (L) 01/20/2019   BILITOT 0.9 01/20/2019   Lab Results  Component Value Date   CHOL 193 09/25/2018   HDL 68.80 09/25/2018   LDLCALC 114 (H) 09/25/2018   TRIG 55.0 09/25/2018   CHOLHDL 3 09/25/2018    Lab Results  Component Value Date   HGBA1C 5.4 09/25/2018   Lab Results  Component Value Date   TSH 2.65 04/26/2017     STUDIES/PROCEDURES reviewed today   Echo 09/24/2019 1. Left ventricular ejection fraction, by estimation, is 55 to 60%. The  left  ventricle has normal function. The left ventricle has no regional  wall motion abnormalities. Left ventricular diastolic parameters were  normal. The average left ventricular  global longitudinal strain is -21.3 %. The global longitudinal strain is  normal.  2. Right ventricular systolic function is normal. The right ventricular  size is normal.  3. The mitral valve is normal in structure. No evidence of mitral valve  regurgitation. No evidence of mitral stenosis.  4. The aortic valve is normal in structure. Aortic valve regurgitation is  not visualized. No aortic stenosis is present.  5. The inferior vena cava is normal in size with greater than 50%  respiratory variability, suggesting right atrial pressure of 3 mmHg.   Zio  07/2019 Min HR 45bpm, max HR 143bpm, avg HR 74bpm,  NSR predominant rhythm with isolated rare PACs, rare atrial couplets, and rare atrial triplets. Isolated PVCs and ventricular couplets rare. Trigeminy present. Pending MD review  Assessment & Plan    Palpitations / PVCs --Improved palpitations, attributed to peace of mind achieved with ongoing workup. He has days with more  symptomatic PVCs than others with unclear triggers, though he has wondered at previous visits if burden of PVCs associated with amount of sleep. ZioXT as above with NSR and rare ectopy. Echo with nl pump function, no WMA, and no evidence of valvular dz.  --Given intermittent symptomatic palpitations, discussed possible PRN medications. Specifically, discussed low dose PRN propanolol for a short acting medication option that he can take on days with more frequent symptoms that will likely also calm any associated health anxiety. Diltiazem also discussed at previous visits and an option given normal EF, as well as given studies show better exercise tolerance associated with this medication over that of BB. Pt preference is to avoid medications at this time. He will contact us if he changes his mind with recommendation he first trial PRN propanolol at that time. Discussed that for additional peace of mind without medications,  HR and EKG monitoring on Apple Watch / Kardia or AliveCor may be of benefit. No further workup needed at this time.  Noncardiac Chest Pain --Resolved. No recent CP. Previous cath with nl cors. Most recent echo and Zio findings as above without significant findings. PRN propanolol versus diltiazem discussed as above. Ongoing lifestyle modification recommended, including sleep, diet, and exercise.   Medication changes: None Labs ordered: None Studies / Imaging ordered: None Future considerations: PRN propanolol trail versus diltiazem for sx control.  Disposition: RTC as needed   Arvil Chaco, PA-C 10/16/2019

## 2020-06-21 ENCOUNTER — Other Ambulatory Visit: Payer: Self-pay

## 2020-06-21 ENCOUNTER — Ambulatory Visit
Admission: EM | Admit: 2020-06-21 | Discharge: 2020-06-21 | Disposition: A | Discharge status: Home / Self Care | Payer: BC Managed Care – PPO | Attending: Emergency Medicine | Admitting: Emergency Medicine

## 2020-06-21 DIAGNOSIS — J069 Acute upper respiratory infection, unspecified: Secondary | ICD-10-CM

## 2020-06-21 DIAGNOSIS — Z1152 Encounter for screening for COVID-19: Secondary | ICD-10-CM

## 2020-06-21 MED ORDER — OSELTAMIVIR PHOSPHATE 75 MG PO CAPS: 75.0000 mg | ORAL_CAPSULE | Freq: Two times a day (BID) | ORAL | 0 refills | Status: DC

## 2020-06-21 NOTE — ED Triage Notes (Signed)
Patient presents to Urgent Care with complaints of headache, nasal congestion, and fever (last temp 101.0 at 1030) since yesterday. Treating with Tylenol with sinus severe med for fever last dose at 0900.  Denies n/v or diarrhea.

## 2020-06-21 NOTE — Discharge Instructions (Signed)
Your COVID and Influenza tests are pending.  You should self quarantine until the test results are back.    Take the Tamiflu as directed; stop taking this medication if your influenza test is negative.    Take Tylenol or ibuprofen as needed for fever or discomfort.  Rest and keep yourself hydrated.    Follow-up with your primary care provider if your symptoms are not improving.

## 2020-06-21 NOTE — ED Provider Notes (Signed)
[name]    CSN: [id number] Arrival date & time: 06/21/20  1108      History   Chief Complaint Chief Complaint  Patient presents with  . Headache  . Fever  . Nasal Congestion    HPI John Snow is a 35 y.o. male.   Patient presents with 1 day history of fever, headache, nasal congestion.  T-max 101.  He denies rash, sore throat, cough, shortness of breath, vomiting, diarrhea, or other symptoms.  Treatment at home with Tylenol sinus.  His medical history includes MI and GERD.  The history is provided by the patient and medical records.    Past Medical History:  Diagnosis Date  . GERD (gastroesophageal reflux disease)   . Heart attack [facility]) 2007    Patient Active Problem List   Diagnosis Date Noted  . Routine general medical examination at a health care facility 09/25/2018  . Groin pain 01/14/2018  . Acute right-sided thoracic back pain 11/13/2017  . Acute lumbar back pain 11/13/2017  . Alopecia 10/01/2017  . Hyperpigmented skin lesion 07/01/2017  . Plantar wart of left foot 07/01/2017  . GERD (gastroesophageal reflux disease) 04/26/2017  . Hypersomnia 12/15/2016  . Allergic rhinitis 12/27/2015  . Shift work sleep disorder 12/27/2015  . Family history of prostate cancer in father 12/27/2015  . Palpitations 07/26/2015  . History of MI (myocardial infarction) 05/28/2015  . Obesity 05/28/2015    History reviewed. No pertinent surgical history.     Home Medications    Prior to Admission medications   Medication Sig Start Date End Date Taking? Authorizing Provider  oseltamivir (TAMIFLU) 75 MG capsule Take 1 capsule (75 mg total) by mouth every 12 (twelve) hours. 06/21/20  Yes Sharion Balloon, NP  cholecalciferol (VITAMIN D) 1000 units tablet Take 1,000 Units by mouth daily.    [provider]    Family History Family History  Problem Relation Age of Onset  . Cancer Father 43       Not prostate cancer  . Hypertension Maternal  Grandmother     Social History Social History   Tobacco Use  . Smoking status: Never Smoker  . Smokeless tobacco: Never Used  Vaping Use  . Vaping Use: Never used  Substance Use Topics  . Alcohol use: Yes    Alcohol/week: 0.0 standard drinks    Comment: occasional  . Drug use: No     Allergies   Patient has no known allergies.   Review of Systems Review of Systems  Constitutional: Positive for fever. Negative for chills.  HENT: Positive for congestion. Negative for ear pain and sore throat.   Eyes: Negative for pain and visual disturbance.  Respiratory: Negative for cough and shortness of breath.   Cardiovascular: Negative for chest pain and palpitations.  Gastrointestinal: Negative for abdominal pain, diarrhea and vomiting.  Genitourinary: Negative for dysuria and hematuria.  Musculoskeletal: Negative for arthralgias and back pain.  Skin: Negative for color change and rash.  Neurological: Positive for headaches. Negative for dizziness, syncope, weakness and numbness.  All other systems reviewed and are negative.    Physical Exam Triage Vital Signs ED Triage Vitals  Enc Vitals Group     BP      Pulse      Resp      Temp      Temp src      SpO2      Weight      Height      Head Circumference  Peak Flow      Pain Score      Pain Loc      Pain Edu?      Excl. in [place]?    No data found.  Updated Vital Signs BP 118/74 (BP Location: Left Arm)   Pulse (!) 114   Temp 99.1 F (37.3 C) (Oral)   Resp 16   Wt 218 lb (98.9 kg)   SpO2 95%   BMI 31.28 kg/m   Visual Acuity Right Eye Distance:   Left Eye Distance:   Bilateral Distance:    Right Eye Near:   Left Eye Near:    Bilateral Near:     Physical Exam Vitals and nursing note reviewed.  Constitutional:      General: He is not in acute distress.    Appearance: He is well-developed.  HENT:     Head: Normocephalic and atraumatic.     Right Ear: Tympanic membrane normal.     Left Ear: Tympanic  membrane normal.     Nose: Nose normal.     Mouth/Throat:     Mouth: Mucous membranes are moist.     Pharynx: Oropharynx is clear.  Eyes:     Conjunctiva/sclera: Conjunctivae normal.  Cardiovascular:     Rate and Rhythm: Normal rate and regular rhythm.     Heart sounds: Normal heart sounds.  Pulmonary:     Effort: Pulmonary effort is normal. No respiratory distress.     Breath sounds: Normal breath sounds.  Abdominal:     Palpations: Abdomen is soft.     Tenderness: There is no abdominal tenderness.  Musculoskeletal:     Cervical back: Neck supple.  Skin:    General: Skin is warm and dry.  Neurological:     General: No focal deficit present.     Mental Status: He is alert and oriented to person, place, and time.  Psychiatric:        Mood and Affect: Mood normal.        Behavior: Behavior normal.      UC Treatments / Results  Labs (all labs ordered are listed, but only abnormal results are displayed) Labs Reviewed  COVID-19, FLU A+B NAA    EKG   Radiology No results found.  Procedures Procedures (including critical care time)  Medications Ordered in UC Medications - No data to display  Initial Impression / Assessment and Plan / UC Course  I have reviewed the triage vital signs and the nursing notes.  Pertinent labs & imaging results that were available during my care of the patient were reviewed by me and considered in my medical decision making (see chart for details).   Viral URI.  COVID and influenza test pending.  Instructed patient to self quarantine until the test results are back.  Treating with Tamiflu; instructed patient to stop taking this medication if his influenza test is negative.  Discussed symptomatic treatment with Tylenol or ibuprofen, rest, hydration.  Instructed patient to follow-up with his PCP if his symptoms or not improving.  He agrees to plan of care.   Final Clinical Impressions(s) / UC Diagnoses   Final diagnoses:  Viral URI      Discharge Instructions     Your COVID and Influenza tests are pending.  You should self quarantine until the test results are back.    Take the Tamiflu as directed; stop taking this medication if your influenza test is negative.    Take Tylenol or ibuprofen as needed for  fever or discomfort.  Rest and keep yourself hydrated.    Follow-up with your primary care provider if your symptoms are not improving.        ED Prescriptions    Medication Sig Dispense Auth. Provider   oseltamivir (TAMIFLU) 75 MG capsule Take 1 capsule (75 mg total) by mouth every 12 (twelve) hours. 10 capsule Sharion Balloon, NP     PDMP not reviewed this encounter.   Sharion Balloon, NP 06/21/20 1152

## 2020-06-22 LAB — COVID-19, FLU A+B NAA
Influenza A, NAA: NOT DETECTED
Influenza B, NAA: NOT DETECTED
SARS-CoV-2, NAA: DETECTED — AB

## 2020-10-04 ENCOUNTER — Encounter: Payer: Self-pay | Admitting: Family Medicine

## 2020-10-04 ENCOUNTER — Other Ambulatory Visit: Payer: Self-pay

## 2020-10-04 ENCOUNTER — Ambulatory Visit (INDEPENDENT_AMBULATORY_CARE_PROVIDER_SITE_OTHER): Payer: BC Managed Care – PPO | Admitting: Family Medicine

## 2020-10-04 VITALS — BP 110/80 | HR 75 | Temp 98.2°F | Ht 70.0 in | Wt 229.2 lb

## 2020-10-04 DIAGNOSIS — Z8042 Family history of malignant neoplasm of prostate: Secondary | ICD-10-CM

## 2020-10-04 DIAGNOSIS — E559 Vitamin D deficiency, unspecified: Secondary | ICD-10-CM | POA: Diagnosis not present

## 2020-10-04 DIAGNOSIS — Z6832 Body mass index (BMI) 32.0-32.9, adult: Secondary | ICD-10-CM | POA: Diagnosis not present

## 2020-10-04 DIAGNOSIS — E6609 Other obesity due to excess calories: Secondary | ICD-10-CM | POA: Diagnosis not present

## 2020-10-04 DIAGNOSIS — Z1159 Encounter for screening for other viral diseases: Secondary | ICD-10-CM | POA: Diagnosis not present

## 2020-10-04 DIAGNOSIS — Z0001 Encounter for general adult medical examination with abnormal findings: Secondary | ICD-10-CM

## 2020-10-04 DIAGNOSIS — J309 Allergic rhinitis, unspecified: Secondary | ICD-10-CM | POA: Diagnosis not present

## 2020-10-04 DIAGNOSIS — L659 Nonscarring hair loss, unspecified: Secondary | ICD-10-CM

## 2020-10-04 DIAGNOSIS — Z13220 Encounter for screening for lipoid disorders: Secondary | ICD-10-CM

## 2020-10-04 NOTE — Progress Notes (Signed)
John Rumps, MD Phone: [phone]  John Snow is a 35 y.o. male who presents today for CPE.  Diet: Generally healthy.  No fried foods, red meat, or sugar intake Exercise: His job is very active, he also walks Colonoscopy: Not indicated Prostate cancer screening: Due, father with prostate cancer early in life Family history-  Prostate cancer: Father  Colon cancer: No Vaccines-   Flu: Encouraged him to get it this flu season  Tetanus: Due  COVID19: Due HIV screening: Up-to-date Hep C Screening: Due Tobacco use: No Alcohol use: Minimal Illicit Drug use: No Dentist: No Ophthalmology: No  Alopecia: Patient never saw dermatology.  He has a spot in his beard that is missing here in a circular pattern.  Allergic rhinitis: Patient notes chronic intermittent issues with stuffiness and congestion.  Notes if he bends over the congestion will get worse when it does occur.  No rhinorrhea.  No sneezing.  Occasional postnasal drip.  No vertigo.   Active Ambulatory Problems    Diagnosis Date Noted   History of MI (myocardial infarction) 05/28/2015   Obesity 05/28/2015   Palpitations 07/26/2015   Allergic rhinitis 12/27/2015   Shift work sleep disorder 12/27/2015   Family history of prostate cancer in father 12/27/2015   Hypersomnia 12/15/2016   GERD (gastroesophageal reflux disease) 04/26/2017   Hyperpigmented skin lesion 07/01/2017   Plantar wart of left foot 07/01/2017   Alopecia 10/01/2017   Acute right-sided thoracic back pain 11/13/2017   Acute lumbar back pain 11/13/2017   Groin pain 01/14/2018   Encounter for general adult medical examination with abnormal findings 09/25/2018   Resolved Ambulatory Problems    Diagnosis Date Noted   Acute upper respiratory infection 05/28/2015   Sore throat 09/02/2015   Pharyngitis 06/01/2016   Influenza B 06/06/2016   Tingling 12/15/2016   Weight loss 04/26/2017   Penile discharge, without blood 01/14/2018   Past Medical  History:  Diagnosis Date   [place] attack [place]) 2007    Family History  Problem Relation Age of Onset   Cancer Father 7       Not prostate cancer   Hypertension Maternal Grandmother     Social History   Socioeconomic History   Marital status: Married    Spouse name: Not on file   Number of children: Not on file   Years of education: Not on file   Highest education level: Not on file  Occupational History   Not on file  Tobacco Use   Smoking status: Never   Smokeless tobacco: Never  Vaping Use   Vaping Use: Never used  Substance and Sexual Activity   Alcohol use: Yes    Alcohol/week: 0.0 standard drinks    Comment: occasional   Drug use: No   Sexual activity: Not on file  Other Topics Concern   Not on file  Social History Narrative   Not on file   Social Determinants of Health   Financial Resource Strain: Not on file  Food Insecurity: Not on file  Transportation Needs: Not on file  Physical Activity: Not on file  Stress: Not on file  Social Connections: Not on file  Intimate Partner Violence: Not on file    ROS  General:  Negative for nexplained weight loss, fever Skin: Negative for new or changing mole, sore that won't heal HEENT: Negative for trouble hearing, trouble seeing, ringing in ears, mouth sores, hoarseness, change in voice, dysphagia. CV:  Negative for chest pain, dyspnea, edema, palpitations Resp: Negative for  cough, dyspnea, hemoptysis GI: Negative for nausea, vomiting, diarrhea, constipation, abdominal pain, melena, hematochezia. GU: Negative for dysuria, incontinence, urinary hesitance, hematuria, vaginal or penile discharge, polyuria, sexual difficulty, lumps in testicle or breasts MSK: Negative for muscle cramps or aches, joint pain or swelling Neuro: Negative for headaches, weakness, numbness, dizziness, passing out/fainting Psych: Negative for depression, anxiety, memory problems  Objective  Physical Exam Vitals:   10/04/20 1426  BP:  110/80  Pulse: 75  Temp: 98.2 F (36.8 C)  SpO2: 98%    BP Readings from Last 3 Encounters:  10/04/20 110/80  06/21/20 118/74  10/16/19 100/78   Wt Readings from Last 3 Encounters:  10/04/20 229 lb 3.2 oz (104 kg)  06/21/20 218 lb (98.9 kg)  10/16/19 213 lb 8 oz (96.8 kg)    Physical Exam Constitutional:      General: He is not in acute distress.    Appearance: He is not diaphoretic.  HENT:     Head: Normocephalic and atraumatic.     Right Ear: Tympanic membrane normal.     Left Ear: Tympanic membrane normal.  Eyes:     Conjunctiva/sclera: Conjunctivae normal.     Pupils: Pupils are equal, round, and reactive to light.  Cardiovascular:     Rate and Rhythm: Normal rate and regular rhythm.     Heart sounds: Normal heart sounds.  Pulmonary:     Effort: Pulmonary effort is normal.     Breath sounds: Normal breath sounds.  Abdominal:     General: Bowel sounds are normal. There is no distension.     Palpations: Abdomen is soft.     Tenderness: There is no abdominal tenderness. There is no guarding or rebound.  Musculoskeletal:     Right lower leg: No edema.     Left lower leg: No edema.  Lymphadenopathy:     Cervical: No cervical adenopathy.  Skin:    General: Skin is warm and dry.  Neurological:     Mental Status: He is alert.  Psychiatric:        Mood and Affect: Mood normal.     Assessment/Plan:   Problem List Items Addressed This Visit     Allergic rhinitis    He will trial Flonase.  If not beneficial he will let us know.  Discussed risk of nosebleeds with this.       Alopecia    The patient never ended up seeing dermatology.  We will place a referral.       Relevant Orders   Ambulatory referral to Dermatology   Encounter for general adult medical examination with abnormal findings - Primary    Physical exam completed.  I encouraged continued healthy diet and exercise.  PSA will be ordered for prostate cancer screening particularly given his  father's young age at diagnosis of prostate cancer.  I encouraged tetanus vaccine and COVID vaccination though he declines both of these.  He was advised they are available to him if he would like them in the future.  Hepatitis C screening to be completed.  Lab work as outlined.       Family history of prostate cancer in father   Relevant Orders   PSA   Obesity   Relevant Orders   HgB A1c   Other Visit Diagnoses     Need for hepatitis C screening test       Relevant Orders   Hepatitis C Antibody   Lipid screening       Relevant Orders  Comp Met (CMET)   Lipid panel   Vitamin D deficiency       Relevant Orders   Vitamin D (25 hydroxy)       Return in about 1 year (around 10/04/2021) for CPE.  This visit occurred during the SARS-CoV-2 public health emergency.  Safety protocols were in place, including screening questions prior to the visit, additional usage of staff PPE, and extensive cleaning of exam room while observing appropriate contact time as indicated for disinfecting solutions.    John Rumps, MD [place]

## 2020-10-04 NOTE — Assessment & Plan Note (Signed)
The patient never ended up seeing dermatology.  We will place a referral.

## 2020-10-04 NOTE — Assessment & Plan Note (Signed)
Physical exam completed.  I encouraged continued healthy diet and exercise.  PSA will be ordered for prostate cancer screening particularly given his father's young age at diagnosis of prostate cancer.  I encouraged tetanus vaccine and COVID vaccination though he declines both of these.  He was advised they are available to him if he would like them in the future.  Hepatitis C screening to be completed.  Lab work as outlined.

## 2020-10-04 NOTE — Patient Instructions (Signed)
Nice to see you. Please try the Flonase for your allergies. Dermatology should call you regarding your appointment. We will contact you with your lab results.

## 2020-10-04 NOTE — Assessment & Plan Note (Signed)
He will trial Flonase.  If not beneficial he will let us know.  Discussed risk of nosebleeds with this.

## 2020-10-05 LAB — HEMOGLOBIN A1C: Hgb A1c MFr Bld: 5.5 % (ref 4.6–6.5)

## 2020-10-05 LAB — HEPATITIS C ANTIBODY
Hepatitis C Ab: NONREACTIVE
SIGNAL TO CUT-OFF: 0.03 (ref <1.00)

## 2020-10-05 NOTE — Addendum Note (Signed)
Addended by: Leeanne Rio on: 10/05/2020 10:16 AM   Modules accepted: Orders

## 2020-10-06 LAB — COMPREHENSIVE METABOLIC PANEL
ALT: 15 IU/L (ref 0–44)
AST: 24 IU/L (ref 0–40)
Albumin/Globulin Ratio: 1.5 (ref 1.2–2.2)
Albumin: 4.5 g/dL (ref 4.0–5.0)
Alkaline Phosphatase: 47 IU/L (ref 44–121)
BUN/Creatinine Ratio: 14 (ref 9–20)
BUN: 14 mg/dL (ref 6–20)
Bilirubin Total: 0.5 mg/dL (ref 0.0–1.2)
CO2: 23 mmol/L (ref 20–29)
Calcium: 9.3 mg/dL (ref 8.7–10.2)
Chloride: 100 mmol/L (ref 96–106)
Creatinine, Ser: 1 mg/dL (ref 0.76–1.27)
Globulin, Total: 3.1 g/dL (ref 1.5–4.5)
Glucose: 78 mg/dL (ref 65–99)
Potassium: 4.5 mmol/L (ref 3.5–5.2)
Sodium: 137 mmol/L (ref 134–144)
Total Protein: 7.6 g/dL (ref 6.0–8.5)
eGFR: 100 mL/min/{1.73_m2} (ref >59)

## 2020-10-06 LAB — LIPID PANEL
Chol/HDL Ratio: 2.4 ratio (ref 0.0–5.0)
Cholesterol, Total: 184 mg/dL (ref 100–199)
HDL: 78 mg/dL (ref >39)
LDL Chol Calc (NIH): 96 mg/dL (ref 0–99)
Triglycerides: 54 mg/dL (ref 0–149)
VLDL Cholesterol Cal: 10 mg/dL (ref 5–40)

## 2020-10-06 LAB — VITAMIN D 25 HYDROXY (VIT D DEFICIENCY, FRACTURES): Vit D, 25-Hydroxy: 29.7 ng/mL — ABNORMAL LOW (ref 30.0–100.0)

## 2020-10-06 LAB — PSA: Prostate Specific Ag, Serum: 0.5 ng/mL (ref 0.0–4.0)

## 2020-10-07 ENCOUNTER — Encounter: Payer: Self-pay | Admitting: Family Medicine

## 2020-10-07 ENCOUNTER — Other Ambulatory Visit: Payer: Self-pay | Admitting: Family Medicine

## 2020-10-07 DIAGNOSIS — E559 Vitamin D deficiency, unspecified: Secondary | ICD-10-CM

## 2020-10-07 DIAGNOSIS — D582 Other hemoglobinopathies: Secondary | ICD-10-CM

## 2020-10-07 DIAGNOSIS — Z0001 Encounter for general adult medical examination with abnormal findings: Secondary | ICD-10-CM

## 2020-10-08 NOTE — Addendum Note (Signed)
Addended by: Fulton Mole D on: 10/08/2020 11:44 AM   Modules accepted: Orders

## 2020-10-13 ENCOUNTER — Other Ambulatory Visit (INDEPENDENT_AMBULATORY_CARE_PROVIDER_SITE_OTHER): Payer: BC Managed Care – PPO

## 2020-10-13 ENCOUNTER — Other Ambulatory Visit: Payer: Self-pay

## 2020-10-13 DIAGNOSIS — D582 Other hemoglobinopathies: Secondary | ICD-10-CM

## 2020-10-14 LAB — CBC
HCT: 48.6 % (ref 39.0–52.0)
Hemoglobin: 15.8 g/dL (ref 13.0–17.0)
MCHC: 32.6 g/dL (ref 30.0–36.0)
MCV: 94 fl (ref 78.0–100.0)
Platelets: 243 10*3/uL (ref 150.0–400.0)
RBC: 5.16 Mil/uL (ref 4.22–5.81)
RDW: 13.3 % (ref 11.5–15.5)
WBC: 7.7 10*3/uL (ref 4.0–10.5)

## 2021-01-06 ENCOUNTER — Other Ambulatory Visit: Payer: BC Managed Care – PPO

## 2021-01-20 ENCOUNTER — Other Ambulatory Visit: Payer: BC Managed Care – PPO

## 2021-02-02 ENCOUNTER — Encounter: Payer: Self-pay | Admitting: Family

## 2021-02-02 ENCOUNTER — Ambulatory Visit (INDEPENDENT_AMBULATORY_CARE_PROVIDER_SITE_OTHER): Payer: BC Managed Care – PPO | Admitting: Family

## 2021-02-02 ENCOUNTER — Other Ambulatory Visit: Payer: Self-pay

## 2021-02-02 VITALS — BP 110/82 | HR 124 | Temp 99.5°F | Ht 70.0 in | Wt 230.2 lb

## 2021-02-02 DIAGNOSIS — H9201 Otalgia, right ear: Secondary | ICD-10-CM | POA: Diagnosis not present

## 2021-02-02 LAB — POCT RAPID STREP A (OFFICE): Rapid Strep A Screen: NEGATIVE

## 2021-02-02 MED ORDER — AMOXICILLIN-POT CLAVULANATE 875-125 MG PO TABS: 1.0000 | ORAL_TABLET | Freq: Two times a day (BID) | ORAL | 0 refills | Status: AC

## 2021-02-02 NOTE — Progress Notes (Signed)
Subjective:    Patient ID: John Snow, male    DOB: 19-Dec-1985, 35 y.o.   MRN: [id number]  CC: John Snow is a 35 y.o. male who presents today for an acute visit.    HPI: Complains of right ear pain and pressure x 1 days, worsening. Abrupt onset.  Pain with opening mouth or swallowing.   No dental caries or sensitives of teeth.   No sore throat, cough, wheezing, chills, nasal congestion., vision changes, HA, sneezing, itchy watery eyes.   Daughter had sore throat last week.     History of seasonal allergies HISTORY:  Past Medical History:  Diagnosis Date   GERD (gastroesophageal reflux disease)    Heart attack ([place]) 2007   No past surgical history on file. Family History  Problem Relation Age of Onset   Cancer Father 67       Not prostate cancer   Hypertension Maternal Grandmother     Allergies: Patient has no known allergies. Current Outpatient Medications on File Prior to Visit  Medication Sig Dispense Refill   cholecalciferol (VITAMIN D) 1000 units tablet Take 1,000 Units by mouth daily.     No current facility-administered medications on file prior to visit.    Social History   Tobacco Use   Smoking status: Never   Smokeless tobacco: Never  Vaping Use   Vaping Use: Never used  Substance Use Topics   Alcohol use: Yes    Alcohol/week: 0.0 standard drinks    Comment: occasional   Drug use: No    Review of Systems  Constitutional:  Negative for chills and fever.  HENT:  Positive for ear pain. Negative for congestion, ear discharge, facial swelling, rhinorrhea, sinus pain and sneezing.   Eyes:  Negative for visual disturbance.  Respiratory:  Negative for cough.   Cardiovascular:  Negative for chest pain and palpitations.  Gastrointestinal:  Negative for nausea and vomiting.  Neurological:  Negative for headaches.     Objective:    BP 110/82 (BP Location: Left Arm, Patient Position: Sitting, Cuff Size: Large)   Pulse (!) 124   Temp 99.5 F  (37.5 C) (Oral)   Ht 5\' 10"  (1.778 m)   Wt 230 lb 3.2 oz (104.4 kg)   SpO2 98%   BMI 33.03 kg/m    Physical Exam Vitals reviewed.  Constitutional:      Appearance: He is well-developed.  HENT:     Head: Normocephalic and atraumatic.     Jaw: No tenderness, swelling, pain on movement or malocclusion.      Comments: Pain proximal to right ear as marked on diagram. No pain of TMJ or pain with side to side movement, openings. Pain localizes to right ear.     Right Ear: Hearing, ear canal and external ear normal. No decreased hearing noted. No drainage, swelling or tenderness. No middle ear effusion. Tympanic membrane is bulging. Tympanic membrane is not injected or erythematous.     Left Ear: Hearing, tympanic membrane, ear canal and external ear normal. No decreased hearing noted. No drainage, swelling or tenderness.  No middle ear effusion. Tympanic membrane is not injected, erythematous or bulging.     Nose: Nose normal.     Right Sinus: No maxillary sinus tenderness or frontal sinus tenderness.     Left Sinus: No maxillary sinus tenderness or frontal sinus tenderness.     Mouth/Throat:     Pharynx: Uvula midline. No oropharyngeal exudate or posterior oropharyngeal erythema.     Tonsils: No  tonsillar abscesses.  Eyes:     Conjunctiva/sclera: Conjunctivae normal.  Cardiovascular:     Rate and Rhythm: Regular rhythm.     Heart sounds: Normal heart sounds.  Pulmonary:     Effort: Pulmonary effort is normal. No respiratory distress.     Breath sounds: Normal breath sounds. No wheezing, rhonchi or rales.  Lymphadenopathy:     Head:     Right side of head: No submental, submandibular, tonsillar, preauricular, posterior auricular or occipital adenopathy.     Left side of head: No submental, submandibular, tonsillar, preauricular, posterior auricular or occipital adenopathy.     Cervical: No cervical adenopathy.  Skin:    General: Skin is warm and dry.  Neurological:     Mental  Status: He is alert.  Psychiatric:        Speech: Speech normal.        Behavior: Behavior normal.       Assessment & Plan:   Problem List Items Addressed This Visit       Other   Right ear pain - Primary    Abrupt onset.  Patient is afebrile. Negative strep. Pending PCR flu, rsv, covid.   No significant lymphadenopathy, or symptoms to suggest TMJ. Start augmentin to cover for ear bacterial ottitis media.  He will let me know how he is doing.      Relevant Medications   amoxicillin-clavulanate (AUGMENTIN) 875-125 MG tablet   Other Relevant Orders   POCT rapid strep A (Completed)   COVID-19, Flu A+B and RSV      I am having John Snow start on amoxicillin-clavulanate. I am also having him maintain his cholecalciferol.   Meds ordered this encounter  Medications   amoxicillin-clavulanate (AUGMENTIN) 875-125 MG tablet    Sig: Take 1 tablet by mouth 2 (two) times daily for 7 days.    Dispense:  14 tablet    Refill:  0    Order Specific Question:   Supervising Provider    Answer:   Crecencio Mc [[id number]]    Return precautions given.   Risks, benefits, and alternatives of the medications and treatment plan prescribed today were discussed, and patient expressed understanding.   Education regarding symptom management and diagnosis given to patient on AVS.  Continue to follow with John Haven, MD for routine health maintenance.   John Snow and I agreed with plan.   John Paris, FNP

## 2021-02-02 NOTE — Assessment & Plan Note (Addendum)
Abrupt onset.  Patient is afebrile. Negative strep. Pending PCR flu, rsv, covid.   No significant lymphadenopathy, or symptoms to suggest TMJ. Start augmentin to cover for ear bacterial ottitis media.  He will let me know how he is doing.

## 2021-02-02 NOTE — Patient Instructions (Signed)
I have sent in Augmentin (antibiotic) for you to start for suspected early, ear infection.  Ensure to take probiotics while on antibiotics and also for 2 weeks after completion. This can either be by eating yogurt daily or taking a probiotic supplement over the counter such as Culturelle.It is important to re-colonize the gut with good bacteria and also to prevent any diarrheal infections associated with antibiotic use.   If you develop increased pain, rash, fever , facial or ear swelling, shortness of breath, please let us know right away.

## 2021-02-03 LAB — COVID-19, FLU A+B AND RSV
Influenza A, NAA: NOT DETECTED
Influenza B, NAA: NOT DETECTED
RSV, NAA: NOT DETECTED
SARS-CoV-2, NAA: NOT DETECTED

## 2021-03-08 ENCOUNTER — Other Ambulatory Visit: Payer: Self-pay

## 2021-03-08 ENCOUNTER — Encounter: Payer: Self-pay | Admitting: Family

## 2021-03-08 ENCOUNTER — Ambulatory Visit (INDEPENDENT_AMBULATORY_CARE_PROVIDER_SITE_OTHER): Payer: BC Managed Care – PPO | Admitting: Family

## 2021-03-08 VITALS — BP 130/70 | HR 105 | Temp 98.5°F | Ht 70.0 in | Wt 230.0 lb

## 2021-03-08 DIAGNOSIS — R42 Dizziness and giddiness: Secondary | ICD-10-CM

## 2021-03-08 LAB — CBC WITH DIFFERENTIAL/PLATELET
Basophils Absolute: 0.1 10*3/uL (ref 0.0–0.1)
Basophils Relative: 0.8 % (ref 0.0–3.0)
Eosinophils Absolute: 0.1 10*3/uL (ref 0.0–0.7)
Eosinophils Relative: 0.8 % (ref 0.0–5.0)
HCT: 47.1 % (ref 39.0–52.0)
Hemoglobin: 15.5 g/dL (ref 13.0–17.0)
Lymphocytes Relative: 25.2 % (ref 12.0–46.0)
Lymphs Abs: 1.8 10*3/uL (ref 0.7–4.0)
MCHC: 32.9 g/dL (ref 30.0–36.0)
MCV: 92.9 fl (ref 78.0–100.0)
Monocytes Absolute: 0.6 10*3/uL (ref 0.1–1.0)
Monocytes Relative: 7.9 % (ref 3.0–12.0)
Neutro Abs: 4.8 10*3/uL (ref 1.4–7.7)
Neutrophils Relative %: 65.3 % (ref 43.0–77.0)
Platelets: 253 10*3/uL (ref 150.0–400.0)
RBC: 5.07 Mil/uL (ref 4.22–5.81)
RDW: 13.3 % (ref 11.5–15.5)
WBC: 7.3 10*3/uL (ref 4.0–10.5)

## 2021-03-08 LAB — COMPREHENSIVE METABOLIC PANEL
ALT: 18 U/L (ref 0–53)
AST: 19 U/L (ref 0–37)
Albumin: 4.5 g/dL (ref 3.5–5.2)
Alkaline Phosphatase: 34 U/L — ABNORMAL LOW (ref 39–117)
BUN: 16 mg/dL (ref 6–23)
CO2: 26 mEq/L (ref 19–32)
Calcium: 9.6 mg/dL (ref 8.4–10.5)
Chloride: 99 mEq/L (ref 96–112)
Creatinine, Ser: 1 mg/dL (ref 0.40–1.50)
GFR: 96.73 mL/min (ref >60.00)
Glucose, Bld: 99 mg/dL (ref 70–99)
Potassium: 3.8 mEq/L (ref 3.5–5.1)
Sodium: 134 mEq/L — ABNORMAL LOW (ref 135–145)
Total Bilirubin: 0.7 mg/dL (ref 0.2–1.2)
Total Protein: 8 g/dL (ref 6.0–8.3)

## 2021-03-08 NOTE — Progress Notes (Signed)
Acute Office Visit  Subjective:    Patient ID: John Snow, male    DOB: [birth year], 36 y.o.   MRN: [id number]  Chief Complaint  Patient presents with   Acute Visit    Lightheaded yesterday/ BP up and down    HPI Patient is in today with concerns pf feeling lightheaded on Sunday. Patient reports he came into his house his wife was cleaning with chlorox and 409. It was very strong smelling. He reports assisting her with the cleaning and ultimately became lightheaded and had to go outside to feel better. He is here today because he wants to be sure that nothing is causing the lightheadedness. Reports checking his blood pressure and seeing fluctuations at home into the 150s. Has a history of PVCs but denies any chest pain, SOB, or palpitations  Past Medical History:  Diagnosis Date   GERD (gastroesophageal reflux disease)    Heart attack ([place]) 2007    History reviewed. No pertinent surgical history.  Family History  Problem Relation Age of Onset   Cancer Father 43       Not prostate cancer   Hypertension Maternal Grandmother     Social History   Socioeconomic History   Marital status: Married    Spouse name: Not on file   Number of children: Not on file   Years of education: Not on file   Highest education level: Not on file  Occupational History   Not on file  Tobacco Use   Smoking status: Never   Smokeless tobacco: Never  Vaping Use   Vaping Use: Never used  Substance and Sexual Activity   Alcohol use: Yes    Alcohol/week: 0.0 standard drinks    Comment: occasional   Drug use: No   Sexual activity: Not on file  Other Topics Concern   Not on file  Social History Narrative   Not on file   Social Determinants of Health   Financial Resource Strain: Not on file  Food Insecurity: Not on file  Transportation Needs: Not on file  Physical Activity: Not on file  Stress: Not on file  Social Connections: Not on file  Intimate Partner Violence:  Not on file    Outpatient Medications Prior to Visit  Medication Sig Dispense Refill   cholecalciferol (VITAMIN D) 1000 units tablet Take 1,000 Units by mouth daily.     No facility-administered medications prior to visit.    No Known Allergies  Review of Systems  HENT: Negative.    Eyes: Negative.   Respiratory: Negative.    Cardiovascular: Negative.  Negative for chest pain and palpitations.  Gastrointestinal: Negative.   Genitourinary: Negative.   Musculoskeletal: Negative.   Allergic/Immunologic: Negative.   Neurological:  Positive for light-headedness. Negative for weakness.  Psychiatric/Behavioral: Negative.    All other systems reviewed and are negative.     Objective:    Physical Exam Vitals and nursing note reviewed.  Constitutional:      Appearance: Normal appearance.  HENT:     Head: Normocephalic.     Right Ear: Tympanic membrane and ear canal normal.     Left Ear: Tympanic membrane and ear canal normal.  Eyes:     Pupils: Pupils are equal, round, and reactive to light.  Cardiovascular:     Rate and Rhythm: Normal rate and regular rhythm.     Pulses: Normal pulses.     Heart sounds: Normal heart sounds.  Pulmonary:     Effort: Pulmonary effort is normal.  Breath sounds: Normal breath sounds.  Musculoskeletal:        General: Normal range of motion.     Cervical back: Normal range of motion and neck supple.  Skin:    General: Skin is warm and dry.  Neurological:     General: No focal deficit present.     Mental Status: He is alert and oriented to person, place, and time. Mental status is at baseline.     Cranial Nerves: No cranial nerve deficit.     Sensory: No sensory deficit.     Motor: No weakness.     Coordination: Coordination normal.     Gait: Gait normal.     Deep Tendon Reflexes: Reflexes normal.  Psychiatric:        Mood and Affect: Mood normal.        Behavior: Behavior normal.   BP 130/70 (BP Location: Left Arm, Patient  Position: Sitting, Cuff Size: Normal)    Pulse (!) 105    Temp 98.5 F (36.9 C) (Oral)    Ht _0  (1.778 m)    Wt 230 lb (104.3 kg)    SpO2 96%    BMI 33.00 kg/m  Wt Readings from Last 3 Encounters:  03/08/21 230 lb (104.3 kg)  02/02/21 230 lb 3.2 oz (104.4 kg)  10/04/20 229 lb 3.2 oz (104 kg)    Health Maintenance Due  Topic Date Due   Pneumococcal Vaccine 61-31 Years old (1 - PCV) Never done   TETANUS/TDAP  Never done    There are no preventive care reminders to display for this patient.   Lab Results  Component Value Date   TSH 2.65 04/26/2017   Lab Results  Component Value Date   WBC 7.7 10/13/2020   HGB 15.8 10/13/2020   HCT 48.6 10/13/2020   MCV 94.0 10/13/2020   PLT 243.0 10/13/2020   Lab Results  Component Value Date   NA 137 10/04/2020   K 4.5 10/04/2020   CO2 23 10/04/2020   GLUCOSE 78 10/04/2020   BUN 14 10/04/2020   CREATININE 1.00 10/04/2020   BILITOT 0.5 10/04/2020   ALKPHOS 47 10/04/2020   AST 24 10/04/2020   ALT 15 10/04/2020   PROT 7.6 10/04/2020   ALBUMIN 4.5 10/04/2020   CALCIUM 9.3 10/04/2020   ANIONGAP 12 01/20/2019   EGFR 100 10/04/2020   GFR 104.49 11/12/2018   Lab Results  Component Value Date   CHOL 184 10/04/2020   Lab Results  Component Value Date   HDL 78 10/04/2020   Lab Results  Component Value Date   LDLCALC 96 10/04/2020   Lab Results  Component Value Date   TRIG 54 10/04/2020   Lab Results  Component Value Date   CHOLHDL 2.4 10/04/2020   Lab Results  Component Value Date   HGBA1C 5.5 10/04/2020       Assessment & Plan:   Problem List Items Addressed This Visit   None Visit Diagnoses     Lightheadedness    -  Primary   Relevant Orders   Comp Met (CMET)   CBC w/Diff   POC Urinalysis Dipstick      Labs obtained today. Will notify patient pending results. Call he office if symptoms return. Recheck pending labs and sooner as needed.     Kennyth Arnold, FNP

## 2021-03-14 ENCOUNTER — Encounter: Payer: Self-pay | Admitting: Family Medicine

## 2021-03-15 ENCOUNTER — Encounter: Payer: Self-pay | Admitting: Family

## 2021-03-15 ENCOUNTER — Ambulatory Visit (INDEPENDENT_AMBULATORY_CARE_PROVIDER_SITE_OTHER): Payer: BC Managed Care – PPO | Admitting: Family

## 2021-03-15 ENCOUNTER — Other Ambulatory Visit: Payer: Self-pay

## 2021-03-15 VITALS — BP 132/78 | HR 104 | Ht 70.0 in | Wt 216.4 lb

## 2021-03-15 DIAGNOSIS — R42 Dizziness and giddiness: Secondary | ICD-10-CM

## 2021-03-15 DIAGNOSIS — F430 Acute stress reaction: Secondary | ICD-10-CM

## 2021-03-15 MED ORDER — CLONAZEPAM 0.5 MG PO TABS: 0.5000 mg | ORAL_TABLET | Freq: Every day | ORAL | 0 refills | Status: DC | PRN

## 2021-03-16 NOTE — Progress Notes (Signed)
Acute Office Visit  Subjective:    Patient ID: John Snow, male    DOB: 1985/10/18, 36 y.o.   MRN: [id number]  Chief Complaint  Patient presents with   Hypertension    Has gotten elevated readings at home with BP cuff. At times feels light headed like he may pass out.     HPI Patient is in today with concerns of feeling a "rush" that he cannot explain. He reports his blood pressure spiking at times from the 130s to 150s. He reports having a heart attack at age 22. However after reviewing cardiology records, it appears he had a negative cardiac cath, echocardiogram. The thought is maybe he had been taking too many decongestants. He has been worried about a potential MI since then. Reports having heaviness with breathing at times. It is not always associated with activity. Reports this has been happening for years. Would like to see cardiology to be sure he is not having a heart problem. 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 He really wants to pray about these feelings if he is having anxiety.   Past Medical History:  Diagnosis Date   GERD (gastroesophageal reflux disease)    Heart attack ([place]) 2007    No past surgical history on file.  Family History  Problem Relation Age of Onset   Cancer Father 45       Not prostate cancer   Hypertension Maternal Grandmother     Social History   Socioeconomic History   Marital status: Married    Spouse name: Not on file   Number of children: Not on file   Years of education: Not on file   Highest education level: Not on file  Occupational History   Not on file  Tobacco Use   Smoking status: Never   Smokeless tobacco: Never  Vaping Use   Vaping Use: Never used  Substance and Sexual Activity   Alcohol use: Yes    Alcohol/week: 0.0 standard drinks    Comment: occasional   Drug use: No   Sexual activity: Not on file  Other Topics Concern   Not on file  Social History  Narrative   Not on file   Social Determinants of Health   Financial Resource Strain: Not on file  Food Insecurity: Not on file  Transportation Needs: Not on file  Physical Activity: Not on file  Stress: Not on file  Social Connections: Not on file  Intimate Partner Violence: Not on file    Outpatient Medications Prior to Visit  Medication Sig Dispense Refill   cholecalciferol (VITAMIN D) 1000 units tablet Take 1,000 Units by mouth daily.     No facility-administered medications prior to visit.    No Known Allergies  Review of Systems  Constitutional: Negative.   Respiratory:  Positive for chest tightness and shortness of breath.   Cardiovascular: Negative.   Gastrointestinal: Negative.   Endocrine: Negative.   Musculoskeletal: Negative.   Skin: Negative.   Allergic/Immunologic: Negative.   Neurological: Negative.   Hematological: Negative.   Psychiatric/Behavioral:  The patient is nervous/anxious.   All other systems reviewed and are negative.     Objective:    Physical Exam Vitals and nursing note reviewed.  Constitutional:      Appearance: Normal appearance.  HENT:     Head: Normocephalic.     Right Ear: Tympanic membrane and ear canal normal.     Left Ear: Tympanic membrane and  ear canal normal.  Eyes:     Extraocular Movements: Extraocular movements intact.     Pupils: Pupils are equal, round, and reactive to light.  Cardiovascular:     Rate and Rhythm: Normal rate and regular rhythm.  Pulmonary:     Effort: Pulmonary effort is normal.     Breath sounds: Normal breath sounds.  Abdominal:     General: Abdomen is flat.     Palpations: Abdomen is soft.  Musculoskeletal:        General: Normal range of motion.     Cervical back: Normal range of motion and neck supple.  Skin:    General: Skin is warm and dry.  Neurological:     General: No focal deficit present.     Mental Status: He is alert and oriented to person, place, and time.   BP 132/78 (BP  Location: Left Arm, Patient Position: Sitting, Cuff Size: Large)    Pulse (!) 104    Ht _0  (1.778 m)    Wt 216 lb 6.4 oz (98.2 kg)    SpO2 96%    BMI 31.05 kg/m  Wt Readings from Last 3 Encounters:  03/15/21 216 lb 6.4 oz (98.2 kg)  03/08/21 230 lb (104.3 kg)  02/02/21 230 lb 3.2 oz (104.4 kg)    Health Maintenance Due  Topic Date Due   Pneumococcal Vaccine 39-52 Years old (1 - PCV) Never done   TETANUS/TDAP  Never done    There are no preventive care reminders to display for this patient.   Lab Results  Component Value Date   TSH 2.65 04/26/2017   Lab Results  Component Value Date   WBC 7.3 03/08/2021   HGB 15.5 03/08/2021   HCT 47.1 03/08/2021   MCV 92.9 03/08/2021   PLT 253.0 03/08/2021   Lab Results  Component Value Date   NA 134 (L) 03/08/2021   K 3.8 03/08/2021   CO2 26 03/08/2021   GLUCOSE 99 03/08/2021   BUN 16 03/08/2021   CREATININE 1.00 03/08/2021   BILITOT 0.7 03/08/2021   ALKPHOS 34 (L) 03/08/2021   AST 19 03/08/2021   ALT 18 03/08/2021   PROT 8.0 03/08/2021   ALBUMIN 4.5 03/08/2021   CALCIUM 9.6 03/08/2021   ANIONGAP 12 01/20/2019   EGFR 100 10/04/2020   GFR 96.73 03/08/2021   Lab Results  Component Value Date   CHOL 184 10/04/2020   Lab Results  Component Value Date   HDL 78 10/04/2020   Lab Results  Component Value Date   LDLCALC 96 10/04/2020   Lab Results  Component Value Date   TRIG 54 10/04/2020   Lab Results  Component Value Date   CHOLHDL 2.4 10/04/2020   Lab Results  Component Value Date   HGBA1C 5.5 10/04/2020       Assessment & Plan:   Problem List Items Addressed This Visit   None Visit Diagnoses     Intermittent lightheadedness    -  Primary   Relevant Orders   EKG 12-Lead (Completed)   Lightheadedness       Relevant Orders   Ambulatory referral to Cardiology   Stress reaction            Meds ordered this encounter  Medications   clonazePAM (KLONOPIN) 0.5 MG tablet    Sig: Take 1 tablet  (0.5 mg total) by mouth daily as needed for anxiety.    Dispense:  10 tablet    Refill:  0  Patient says he is willing to try a low dose PRN medication for anxiety. He is still very hesitant. Refer to cardiology for possible stress test. Call the office if symptoms worsen or persist. Recheck as scheduled and sooner as needed.   Kennyth Arnold, FNP

## 2021-03-25 ENCOUNTER — Encounter: Payer: Self-pay | Admitting: Nurse Practitioner

## 2021-03-25 ENCOUNTER — Other Ambulatory Visit: Payer: Self-pay

## 2021-03-25 ENCOUNTER — Ambulatory Visit (INDEPENDENT_AMBULATORY_CARE_PROVIDER_SITE_OTHER): Payer: BC Managed Care – PPO | Admitting: Nurse Practitioner

## 2021-03-25 VITALS — BP 123/80 | HR 73 | Ht 70.0 in | Wt 229.4 lb

## 2021-03-25 DIAGNOSIS — R531 Weakness: Secondary | ICD-10-CM

## 2021-03-25 DIAGNOSIS — R002 Palpitations: Secondary | ICD-10-CM

## 2021-03-25 NOTE — Patient Instructions (Signed)
Medication Instructions:  No changes at this time.  *If you need a refill on your cardiac medications before your next appointment, please call your pharmacy*   Lab Work: None  If you have labs (blood work) drawn today and your tests are completely normal, you will receive your results only by: [place] (if you have MyChart) OR A paper copy in the mail If you have any lab test that is abnormal or we need to change your treatment, we will call you to review the results.   Testing/Procedures: None   Follow-Up: At [facility], you and your health needs are our priority.  As part of our continuing mission to provide you with exceptional heart care, we have created designated Provider Care Teams.  These Care Teams include your primary Cardiologist (physician) and Advanced Practice Providers (APPs -  Physician Assistants and Nurse Practitioners) who all work together to provide you with the care you need, when you need it.   Your next appointment:   6 month(s)  The format for your next appointment:   In Person  Provider:   Kathlyn Sacramento, MD or Murray Hodgkins, NP

## 2021-03-25 NOTE — Progress Notes (Signed)
Office Visit    Patient Name: John Snow Date of Encounter: 03/25/2021  Primary Care Provider:  Leone Haven, MD Primary Cardiologist:  Kathlyn Sacramento, MD  Chief Complaint    36 year old male with a history of palpitations, reported history of myocardial infarction with normal coronary arteries on catheterization in 2007, anxiety, GERD, and obesity, who presents for follow-up related weakness.  Past Medical History    Past Medical History:  Diagnosis Date   Chest pain    a. 2007 Cath in setting of chest pain and early repol on ECG: reportedly nl cors.   Early Repolarization on ECG    GERD (gastroesophageal reflux disease)    History of echocardiogram    a. 08/2015 Echo: EF 55-60%, no rwma. Nl RV fxn; b. 09/2019 Echo: EF 55-60%, no rwma, nl RV size/fxn.   Morbid obesity ([place])    Palpitations    a. 03/2020 Zio: RSR, avg HR 74. rare PACs/PVCs <1% burden. Most triggered events did not correlate with arrhythmia.  Some correlated with isolated PVCs.  No significant arrhythmias.   History reviewed. No pertinent surgical history.  Allergies  No Known Allergies  History of Present Illness    36 year old male with the above past medical history including palpitations, reported MI, anxiety, GERD, and obesity.  Patient says at the age of 70, he was suspected of having a myocardial infarction and was admitted to the hospital in [place].  He apparently underwent diagnostic catheterization was told he had normal coronary arteries.  It was later felt that his ECG findings were most consistent w/ early repolarization.  Since then, he has experienced anxiety related to development of cardiac issues.  An echocardiogram in June 2017 showed an EF of 55% without regional wall motion abnormalities.  Follow-up echocardiogram in July 2021, showed an EF of 55 to 60% without regional wall motion abnormalities, and normal RV function.    John Snow was last in cardiology clinic in August  2021,, at which time he continued to report intermittent palpitations.  He was offered as needed beta-blocker or calcium channel blocker therapy, which he declined.  He later underwent event monitoring in January 2022, which showed predominantly sinus rhythm at an average rate of 74 bpm.  He had rare PACs and PVCs with less than 1% burden.  Most triggered events did not correlate with an arrhythmia though some correlated with isolated PVCs.  Overall, there were no significant arrhythmias.  Recently, he had 2 episodes of sudden weakness, each occurring in the late morning, after not eating breakfast and drinking very little fluid.  On each occasion, he had not had anything to eat since about 6 PM the prior evening.  1 episode occurred at his wife's house while the other occurred at work, while he was sitting.  With both episodes, he felt very weak and fatigued with a desire to lay down.  He did not experience dizziness or lightheadedness, and denies chest pain, dyspnea, or unilateral weakness.  Both episodes lasted about 45 minutes and seem to resolve spontaneously he does note that he did eat something shortly after each episode.  He wears an apple watch and we reviewed his heart rate recordings from the days and question and note that he had normal heart rate range from the high 50s to low 100 and teens throughout each of those days.  He works for a [employer], community delivering heavy boxes to businesses.  He does a fair amount of exertion at work, having  to load the truck and then push a dolly to and from the truck.  He does not experience chest pain, dyspnea, or limitations in his activity at work or at home.  He used to exercise regularly but has not done so since the [place] pandemic began, and he is looking to get back to the gym now.  Home Medications    Current Outpatient Medications  Medication Sig Dispense Refill   cholecalciferol (VITAMIN D) 1000 units tablet Take 1,000 Units by mouth daily.      clonazePAM (KLONOPIN) 0.5 MG tablet Take 1 tablet (0.5 mg total) by mouth daily as needed for anxiety. 10 tablet 0   No current facility-administered medications for this visit.     Review of Systems    2 episodes of weakness and fatigue as outlined above.  He denies chest pain, dyspnea, palpitations, PND, orthopnea, dizziness, syncope, edema, or early satiety.  All other systems reviewed and are otherwise negative except as noted above.    Physical Exam    VS:  BP 123/80 (BP Location: Left Arm)    Pulse 73    Ht 5\' 10"  (1.778 m)    Wt 229 lb 6.4 oz (104.1 kg)    SpO2 96%    BMI 32.92 kg/m  , BMI Body mass index is 32.92 kg/m.     GEN: Well nourished, well developed, in no acute distress. HEENT: normal. Neck: Supple, no JVD, carotid bruits, or masses. Cardiac: RRR, no murmurs, rubs, or gallops. No clubbing, cyanosis, edema.  Radials/PT 2+ and equal bilaterally.  Respiratory:  Respirations regular and unlabored, clear to auscultation bilaterally. GI: Soft, nontender, nondistended, BS + x 4. MS: no deformity or atrophy. Skin: warm and dry, no rash. Neuro:  Strength and sensation are intact. Psych: Normal affect.  Accessory Clinical Findings    ECG personally reviewed by me today -sinus arrhythmia, 73, early repolarization- no acute changes.  Lab Results  Component Value Date   WBC 7.3 03/08/2021   HGB 15.5 03/08/2021   HCT 47.1 03/08/2021   MCV 92.9 03/08/2021   PLT 253.0 03/08/2021   Lab Results  Component Value Date   CREATININE 1.00 03/08/2021   BUN 16 03/08/2021   NA 134 (L) 03/08/2021   K 3.8 03/08/2021   CL 99 03/08/2021   CO2 26 03/08/2021   Lab Results  Component Value Date   ALT 18 03/08/2021   AST 19 03/08/2021   ALKPHOS 34 (L) 03/08/2021   BILITOT 0.7 03/08/2021   Lab Results  Component Value Date   CHOL 184 10/04/2020   HDL 78 10/04/2020   LDLCALC 96 10/04/2020   TRIG 54 10/04/2020   CHOLHDL 2.4 10/04/2020    Lab Results  Component Value Date    HGBA1C 5.5 10/04/2020    Assessment & Plan    1.  Weakness/fatigue: Patient with 2 episodes of weakness and fatigue relatively sudden onset, both occurring late in the morning on an empty stomach with minimal hydration and nothing to eat since roughly 16 hours prior to the event.  Each episode lasted about 45 minutes and resolve spontaneously.  He denies presyncope or syncope.  Does not remember feeling particularly irritable or diaphoretic.  We discussed cardiac manifestations of sudden weakness and fatigue, and potential role of placing an event monitor.  He did wear a monitor last January which did not show any significant arrhythmias.  He wears an apple watch daily and we reviewed his heart rate data on his phone,  which showed normal heart rates throughout the days of his events without any significant bradycardia or tachycardia.  In light of that finding, we collectively agreed to hold off on monitoring at this time.  Encouraged him to consider eating breakfast and hydrating better in the mornings, and also to consider checking his blood glucose blood pressure if he were to have a recurrent event.  2.  History of chest pain: Age 62, patient had an episode of chest pain in the setting of early repolarization on ECG, he underwent diagnostic catheterization which was reportedly normal.  He is very active at work without symptoms or limitations and plans on getting back and exercise.  ECG today continues to show sinus rhythm with early repolarization.  Previous echo in July 2021 showed normal LV function.  Encouraged him to start regular exercise program.  3.  History of palpitations: Quiescent.  4.  Disposition: Follow-up in 6 months or sooner if necessary.   Murray Hodgkins, NP 03/25/2021, 4:08 PM

## 2021-04-21 ENCOUNTER — Ambulatory Visit: Payer: BC Managed Care – PPO | Admitting: Dermatology

## 2021-06-20 ENCOUNTER — Ambulatory Visit: Payer: BC Managed Care – PPO | Admitting: Dermatology

## 2021-11-15 ENCOUNTER — Ambulatory Visit: Payer: BC Managed Care – PPO | Attending: Cardiovascular Disease | Admitting: Cardiovascular Disease

## 2021-11-15 ENCOUNTER — Encounter: Payer: Self-pay | Admitting: Cardiovascular Disease

## 2021-11-15 VITALS — BP 126/80 | HR 80 | Ht 70.0 in | Wt 244.0 lb

## 2021-11-15 DIAGNOSIS — R002 Palpitations: Secondary | ICD-10-CM

## 2021-11-15 DIAGNOSIS — R9431 Abnormal electrocardiogram [ECG] [EKG]: Secondary | ICD-10-CM

## 2021-11-15 NOTE — Progress Notes (Signed)
Cardiology Office Note   Date:  11/15/2021   ID:  John Snow, DOB [birth year], MRN [id number]  PCP:  Leone Haven, MD  Cardiologist:   Kathlyn Sacramento, MD   Chief Complaint  Patient presents with   Follow-up    6 month F/U-No new cardiac concerns      History of Present Illness: John Snow is a 36 y.o. male who presents for a follow-up visit regarding palpitations and chronic atypical chest pain.   He was seen by Dr. Yvone Neu in 2017 for palpitations.  Echocardiogram showed normal LV systolic function and no significant valvular abnormalities.  His symptoms resolved without intervention. He reports that he was suspected of having a heart attack at age 79 based on his EKG but was taken to [facility] where he underwent cardiac catheterization which showed normal coronary arteries.  Since then, he has been anxious about a possible cardiac etiology for his symptoms.  He has no history of hypertension, diabetes, hyperlipidemia or tobacco use.  There is no family history of premature coronary artery disease.    He had outpatient monitor in January 2022 which showed sinus rhythm with rare PACs and rare PVCs with a burden of less than 1%.  Most triggered events did not correlate with arrhythmia although symptoms correlated with isolated PVCs.  He has been doing very well with no recent chest pain, shortness of breath or palpitations.  He resumed going to the gym and has been feeling well.  Past Medical History:  Diagnosis Date   Chest pain    a. 2007 Cath in setting of chest pain and early repol on ECG: reportedly nl cors.   Early Repolarization on ECG    GERD (gastroesophageal reflux disease)    History of echocardiogram    a. 08/2015 Echo: EF 55-60%, no rwma. Nl RV fxn; b. 09/2019 Echo: EF 55-60%, no rwma, nl RV size/fxn.   Morbid obesity ([place])    Palpitations    a. 03/2020 Zio: RSR, avg HR 74. rare PACs/PVCs <1% burden. Most triggered events did not correlate with arrhythmia.   Some correlated with isolated PVCs.  No significant arrhythmias.    History reviewed. No pertinent surgical history.   Current Outpatient Medications  Medication Sig Dispense Refill   Black Currant Seed Oil 500 MG CAPS Take by mouth daily.     cholecalciferol (VITAMIN D) 1000 units tablet Take 1,000 Units by mouth daily.     clonazePAM (KLONOPIN) 0.5 MG tablet Take 1 tablet (0.5 mg total) by mouth daily as needed for anxiety. 10 tablet 0   No current facility-administered medications for this visit.    Allergies:   Patient has no known allergies.    Social History:  The patient  reports that he has never smoked. He has never used smokeless tobacco. He reports current alcohol use. He reports that he does not use drugs.   Family History:  The patient's family history includes Cancer (age of onset: 8) in his father; Hypertension in his maternal grandmother.    ROS:  Please see the history of present illness.   Otherwise, review of systems are positive for none.   All other systems are reviewed and negative.    PHYSICAL EXAM: VS:  BP 126/80 (BP Location: Left Arm, Patient Position: Sitting, Cuff Size: Large)   Pulse 80   Ht '5\' 10"'$  (1.778 m)   Wt 244 lb (110.7 kg)   SpO2 98%   BMI 35.01 kg/m  , BMI Body  mass index is 35.01 kg/m. GEN: Well nourished, well developed, in no acute distress  HEENT: normal  Neck: no JVD, carotid bruits, or masses Cardiac: RRR; no murmurs, rubs, or gallops,no edema  Respiratory:  clear to auscultation bilaterally, normal work of breathing GI: soft, nontender, nondistended, + BS MS: no deformity or atrophy  Skin: warm and dry, no rash Neuro:  Strength and sensation are intact Psych: euthymic mood, full affect   EKG:  EKG is ordered today. The ekg ordered today demonstrates normal sinus rhythm with sinus arrhythmia.  Minor J-point elevation consistent with early repolarization   Recent Labs: 03/08/2021: ALT 18; BUN 16; Creatinine, Ser 1.00;  Hemoglobin 15.5; Platelets 253.0; Potassium 3.8; Sodium 134    Lipid Panel    Component Value Date/Time   CHOL 184 10/04/2020 1450   TRIG 54 10/04/2020 1450   HDL 78 10/04/2020 1450   CHOLHDL 2.4 10/04/2020 1450   CHOLHDL 3 09/25/2018 1409   VLDL 11.0 09/25/2018 1409   LDLCALC 96 10/04/2020 1450      Wt Readings from Last 3 Encounters:  11/15/21 244 lb (110.7 kg)  03/25/21 229 lb 6.4 oz (104.1 kg)  03/15/21 216 lb 6.4 oz (98.2 kg)            No data to display            ASSESSMENT AND PLAN:  1.  Previous atypical chest pain: He reports no recurrent symptoms and is currently doing well.  He resumed exercise and feels great.    2.  Palpitations: Outpatient monitor showed no significant arrhythmia other than some isolated PVCs.  He reports no recurrent symptoms.    He can follow-up with [place] as needed.    Signed,  Kathlyn Sacramento, MD  11/15/2021 4:15 PM    [facility]

## 2021-11-15 NOTE — Patient Instructions (Signed)
Medication Instructions:  Your physician recommends that you continue on your current medications as directed. Please refer to the Current Medication list given to you today.  *If you need a refill on your cardiac medications before your next appointment, please call your pharmacy*   Lab Work: None ordered   If you have labs (blood work) drawn today and your tests are completely normal, you will receive your results only by: [place] (if you have MyChart) OR A paper copy in the mail If you have any lab test that is abnormal or we need to change your treatment, we will call you to review the results.   Testing/Procedures: None ordered    Follow-Up: Follow up as needed   Other Instructions   Important Information About Sugar

## 2021-12-20 ENCOUNTER — Telehealth: Payer: Self-pay

## 2021-12-20 NOTE — Telephone Encounter (Signed)
Patient states he would like to transfer his care from Dr. [name] to Mable Paris, [place].  Patient states his wife is a patient of Mable Paris, FNP.

## 2021-12-20 NOTE — Telephone Encounter (Signed)
This is fine with me   

## 2021-12-20 NOTE — Telephone Encounter (Signed)
Okay with me too

## 2021-12-28 NOTE — Telephone Encounter (Signed)
Patient has a Transfer Of Care appointment scheduled with Mable Paris, [place], on 01/05/2022.

## 2022-01-05 ENCOUNTER — Encounter: Payer: Self-pay | Admitting: Family

## 2022-01-05 ENCOUNTER — Ambulatory Visit (INDEPENDENT_AMBULATORY_CARE_PROVIDER_SITE_OTHER): Payer: BC Managed Care – PPO | Admitting: Family

## 2022-01-05 VITALS — BP 128/78 | HR 107 | Temp 98.1°F | Ht 70.0 in | Wt 247.2 lb

## 2022-01-05 DIAGNOSIS — E6609 Other obesity due to excess calories: Secondary | ICD-10-CM

## 2022-01-05 DIAGNOSIS — I252 Old myocardial infarction: Secondary | ICD-10-CM

## 2022-01-05 DIAGNOSIS — Z8042 Family history of malignant neoplasm of prostate: Secondary | ICD-10-CM

## 2022-01-05 DIAGNOSIS — Z6832 Body mass index (BMI) 32.0-32.9, adult: Secondary | ICD-10-CM | POA: Diagnosis not present

## 2022-01-05 DIAGNOSIS — Z20828 Contact with and (suspected) exposure to other viral communicable diseases: Secondary | ICD-10-CM | POA: Insufficient documentation

## 2022-01-05 LAB — PSA: PSA: 0.32 ng/mL (ref 0.10–4.00)

## 2022-01-05 LAB — CBC WITH DIFFERENTIAL/PLATELET
Basophils Absolute: 0 10*3/uL (ref 0.0–0.1)
Basophils Relative: 0.8 % (ref 0.0–3.0)
Eosinophils Absolute: 0.1 10*3/uL (ref 0.0–0.7)
Eosinophils Relative: 1.3 % (ref 0.0–5.0)
HCT: 48.9 % (ref 39.0–52.0)
Hemoglobin: 16.1 g/dL (ref 13.0–17.0)
Lymphocytes Relative: 29 % (ref 12.0–46.0)
Lymphs Abs: 1.6 10*3/uL (ref 0.7–4.0)
MCHC: 32.8 g/dL (ref 30.0–36.0)
MCV: 93.4 fl (ref 78.0–100.0)
Monocytes Absolute: 0.5 10*3/uL (ref 0.1–1.0)
Monocytes Relative: 9.1 % (ref 3.0–12.0)
Neutro Abs: 3.2 10*3/uL (ref 1.4–7.7)
Neutrophils Relative %: 59.8 % (ref 43.0–77.0)
Platelets: 247 10*3/uL (ref 150.0–400.0)
RBC: 5.24 Mil/uL (ref 4.22–5.81)
RDW: 13.1 % (ref 11.5–15.5)
WBC: 5.4 10*3/uL (ref 4.0–10.5)

## 2022-01-05 LAB — TSH: TSH: 0.94 u[IU]/mL (ref 0.35–5.50)

## 2022-01-05 LAB — VITAMIN D 25 HYDROXY (VIT D DEFICIENCY, FRACTURES): VITD: 30.35 ng/mL (ref 30.00–100.00)

## 2022-01-05 LAB — LIPID PANEL
Cholesterol: 178 mg/dL (ref 0–200)
HDL: 67.2 mg/dL (ref >39.00)
LDL Cholesterol: 97 mg/dL (ref 0–99)
NonHDL: 110.62
Total CHOL/HDL Ratio: 3
Triglycerides: 68 mg/dL (ref 0.0–149.0)
VLDL: 13.6 mg/dL (ref 0.0–40.0)

## 2022-01-05 LAB — COMPREHENSIVE METABOLIC PANEL
ALT: 20 U/L (ref 0–53)
AST: 23 U/L (ref 0–37)
Albumin: 4.4 g/dL (ref 3.5–5.2)
Alkaline Phosphatase: 36 U/L — ABNORMAL LOW (ref 39–117)
BUN: 9 mg/dL (ref 6–23)
CO2: 27 mEq/L (ref 19–32)
Calcium: 9.4 mg/dL (ref 8.4–10.5)
Chloride: 101 mEq/L (ref 96–112)
Creatinine, Ser: 1 mg/dL (ref 0.40–1.50)
GFR: 96.16 mL/min (ref >60.00)
Glucose, Bld: 87 mg/dL (ref 70–99)
Potassium: 4.6 mEq/L (ref 3.5–5.1)
Sodium: 135 mEq/L (ref 135–145)
Total Bilirubin: 0.6 mg/dL (ref 0.2–1.2)
Total Protein: 7.6 g/dL (ref 6.0–8.3)

## 2022-01-05 LAB — HEMOGLOBIN A1C: Hgb A1c MFr Bld: 5.6 % (ref 4.6–6.5)

## 2022-01-05 NOTE — Assessment & Plan Note (Signed)
Asymptomatic, pending serology

## 2022-01-05 NOTE — Assessment & Plan Note (Signed)
Congratulated patient on diligence to exercising. Pending labs

## 2022-01-05 NOTE — Assessment & Plan Note (Signed)
Asymptomatic, pending PSA

## 2022-01-05 NOTE — Progress Notes (Signed)
Subjective:    Patient ID: John Snow, male    DOB: Apr 16, 1985, 36 y.o.   MRN: [id number]  CC: John Snow is a 36 y.o. male who presents today to establish care.    HPI: Feels well today Exercises and lifting weights regularly.  Denies chest pain shortness of breath His wife has a history of HSV and he is concerned if he perhaps has as well.  He is never had a rash or blister lesion.   He has seen Dr Fletcher Anon, seen 11/15/21 for previous, chest pain, palpitations.  No significant arrhythmia seen on monitor.  Follow-up as needed  His father had a history of prostate cancer and died young age.  Denies trouble urinating, urinary hesitancy  HISTORY:  Past Medical History:  Diagnosis Date   Chest pain    a. 2007 Cath in setting of chest pain and early repol on ECG: reportedly nl cors.   Early Repolarization on ECG    GERD (gastroesophageal reflux disease)    History of echocardiogram    a. 08/2015 Echo: EF 55-60%, no rwma. Nl RV fxn; b. 09/2019 Echo: EF 55-60%, no rwma, nl RV size/fxn.   Morbid obesity ([place])    Palpitations    a. 03/2020 Zio: RSR, avg HR 74. rare PACs/PVCs <1% burden. Most triggered events did not correlate with arrhythmia.  Some correlated with isolated PVCs.  No significant arrhythmias.   History reviewed. No pertinent surgical history. Family History  Problem Relation Age of Onset   Cancer Father 79       prostate cancer mets to bone   Hypertension Maternal Grandmother     Allergies: Patient has no known allergies. Current Outpatient Medications on File Prior to Visit  Medication Sig Dispense Refill   Black Currant Seed Oil 500 MG CAPS Take by mouth daily.     cholecalciferol (VITAMIN D) 1000 units tablet Take 1,000 Units by mouth daily.     No current facility-administered medications on file prior to visit.    Social History   Tobacco Use   Smoking status: Never   Smokeless tobacco: Never  Vaping Use   Vaping Use: Never used  Substance Use  Topics   Alcohol use: Yes    Alcohol/week: 0.0 standard drinks of alcohol    Comment: occasional   Drug use: No    Review of Systems  Constitutional:  Negative for chills and fever.  Respiratory:  Negative for cough.   Cardiovascular:  Negative for chest pain and palpitations.  Gastrointestinal:  Negative for nausea and vomiting.  Skin:  Negative for rash.      Objective:    BP 128/78 (BP Location: Left Arm, Patient Position: Sitting, Cuff Size: Normal)   Pulse (!) 107   Temp 98.1 F (36.7 C) (Oral)   Ht '5\' 10"'$  (1.778 m)   Wt 247 lb 3.2 oz (112.1 kg)   SpO2 98%   BMI 35.47 kg/m  BP Readings from Last 3 Encounters:  01/05/22 128/78  11/15/21 126/80  03/25/21 123/80   Wt Readings from Last 3 Encounters:  01/05/22 247 lb 3.2 oz (112.1 kg)  11/15/21 244 lb (110.7 kg)  03/25/21 229 lb 6.4 oz (104.1 kg)    Physical Exam Vitals reviewed.  Constitutional:      Appearance: He is well-developed.  Cardiovascular:     Rate and Rhythm: Regular rhythm.     Heart sounds: Normal heart sounds.  Pulmonary:     Effort: Pulmonary effort is normal. No respiratory  distress.     Breath sounds: Normal breath sounds. No wheezing, rhonchi or rales.  Skin:    General: Skin is warm and dry.  Neurological:     Mental Status: He is alert.  Psychiatric:        Speech: Speech normal.        Behavior: Behavior normal.        Assessment & Plan:   Problem List Items Addressed This Visit       Other   Exposure to herpes simplex virus (HSV) - Primary    Asymptomatic, pending serology      Relevant Orders   HSV(herpes simplex vrs) 1+2 ab-IgG   Family history of prostate cancer in father    Asymptomatic, pending PSA      Relevant Orders   PSA   History of MI (myocardial infarction)   Relevant Orders   CBC with Differential/Platelet   Comprehensive metabolic panel   Hemoglobin A1c   Lipid panel   Obesity    Congratulated patient on diligence to exercising. Pending labs       Relevant Orders   TSH   Hemoglobin A1c   Lipid panel   VITAMIN D 25 Hydroxy (Vit-D Deficiency, Fractures)     I have discontinued Tayvien Elizardo's clonazePAM. I am also having him maintain his cholecalciferol and Black Currant Seed Oil.   No orders of the defined types were placed in this encounter.   Return precautions given.   Risks, benefits, and alternatives of the medications and treatment plan prescribed today were discussed, and patient expressed understanding.   Education regarding symptom management and diagnosis given to patient on AVS.  Continue to follow with Leone Haven, MD for routine health maintenance.   Darvin Neighbours and I agreed with plan.   Mable Paris, FNP

## 2022-01-06 LAB — HSV(HERPES SIMPLEX VRS) I + II AB-IGG
HAV 1 IGG,TYPE SPECIFIC AB: <0.9 index
HSV 2 IGG,TYPE SPECIFIC AB: 22.3 index — ABNORMAL HIGH

## 2022-06-18 ENCOUNTER — Ambulatory Visit
Admission: EM | Admit: 2022-06-18 | Discharge: 2022-06-18 | Disposition: A | Discharge status: Home / Self Care | Payer: BC Managed Care – PPO | Attending: Urgent Care | Admitting: Urgent Care

## 2022-06-18 DIAGNOSIS — R6889 Other general symptoms and signs: Secondary | ICD-10-CM | POA: Diagnosis not present

## 2022-06-18 LAB — POCT INFLUENZA A/B
Influenza A, POC: NEGATIVE
Influenza B, POC: NEGATIVE

## 2022-06-18 NOTE — ED Provider Notes (Signed)
John Snow    CSN: [id number] Arrival date & time: 06/18/22  4098      History   Chief Complaint Chief Complaint  Patient presents with   Fever   Cough    HPI John Snow is a 37 y.o. male.    Fever Associated symptoms: cough   Cough Associated symptoms: fever     Presents to urgent care with complaint of fever and productive cough x 2 days.  States he was in contact with his wife who was sick a few days before him, no known diagnosis.  He states she did not have any fever.  Reports Tmax of 100.9 F.  Reports difficulty taking a deep breath with feeling of chest tightness.  Denies history of asthma or other respiratory illness.  PMH includes history of myocardial infarction.  Past Medical History:  Diagnosis Date   Chest pain    a. 2007 Cath in setting of chest pain and early repol on ECG: reportedly nl cors.   Early Repolarization on ECG    GERD (gastroesophageal reflux disease)    History of echocardiogram    a. 08/2015 Echo: EF 55-60%, no rwma. Nl RV fxn; b. 09/2019 Echo: EF 55-60%, no rwma, nl RV size/fxn.   Morbid obesity    Palpitations    a. 03/2020 Zio: RSR, avg HR 74. rare PACs/PVCs <1% burden. Most triggered events did not correlate with arrhythmia.  Some correlated with isolated PVCs.  No significant arrhythmias.    Patient Active Problem List   Diagnosis Date Noted   Exposure to herpes simplex virus (HSV) 01/05/2022   Right ear pain 02/02/2021   Encounter for general adult medical examination with abnormal findings 09/25/2018   Groin pain 01/14/2018   Acute right-sided thoracic back pain 11/13/2017   Acute lumbar back pain 11/13/2017   Alopecia 10/01/2017   Hyperpigmented skin lesion 07/01/2017   Plantar wart of left foot 07/01/2017   GERD (gastroesophageal reflux disease) 04/26/2017   Hypersomnia 12/15/2016   Allergic rhinitis 12/27/2015   Shift work sleep disorder 12/27/2015   Family history of prostate cancer in father  12/27/2015   Palpitations 07/26/2015   History of MI (myocardial infarction) 05/28/2015   Obesity 05/28/2015    History reviewed. No pertinent surgical history.     Home Medications    Prior to Admission medications   Medication Sig Start Date End Date Taking? Authorizing Provider  Black Currant Seed Oil 500 MG CAPS Take by mouth daily.    [provider]  cholecalciferol (VITAMIN D) 1000 units tablet Take 1,000 Units by mouth daily.    [provider]    Family History Family History  Problem Relation Age of Onset   Cancer Father 43       prostate cancer mets to bone   Hypertension Maternal Grandmother     Social History Social History   Tobacco Use   Smoking status: Never   Smokeless tobacco: Never  Vaping Use   Vaping Use: Never used  Substance Use Topics   Alcohol use: Yes    Alcohol/week: 0.0 standard drinks of alcohol    Comment: occasional   Drug use: No     Allergies   Decongestant [pseudoephedrine hcl]   Review of Systems Review of Systems  Constitutional:  Positive for fever.  Respiratory:  Positive for cough.      Physical Exam Triage Vital Signs ED Triage Vitals [06/18/22 0827]  Enc Vitals Group     BP  Pulse      Resp      Temp      Temp src      SpO2      Weight      Height      Head Circumference      Peak Flow      Pain Score 0     Pain Loc      Pain Edu?      Excl. in GC?    No data found.  Updated Vital Signs There were no vitals taken for this visit.  Visual Acuity Right Eye Distance:   Left Eye Distance:   Bilateral Distance:    Right Eye Near:   Left Eye Near:    Bilateral Near:     Physical Exam Vitals reviewed.  Constitutional:      Appearance: Normal appearance. He is not ill-appearing.  Cardiovascular:     Rate and Rhythm: Normal rate and regular rhythm.     Pulses: Normal pulses.     Heart sounds: Normal heart sounds.  Pulmonary:     Effort: Pulmonary effort is normal.      Breath sounds: Wheezing present.  Skin:    General: Skin is warm and dry.  Neurological:     General: No focal deficit present.     Mental Status: He is alert and oriented to person, place, and time.  Psychiatric:        Mood and Affect: Mood normal.        Behavior: Behavior normal.     UC Treatments / Results  Labs (all labs ordered are listed, but only abnormal results are displayed) Labs Reviewed - No data to display  EKG   Radiology No results found.  Procedures Procedures (including critical care time)  Medications Ordered in UC Medications - No data to display  Initial Impression / Assessment and Plan / UC Course  I have reviewed the triage vital signs and the nursing notes.  Pertinent labs & imaging results that were available during my care of the patient were reviewed by me and considered in my medical decision making (see chart for details).   John Snow is a 37 y.o. male presenting with flulike symptoms. Patient is afebrile without recent antipyretics, satting well on room air. Overall is well appearing and non-toxic, well hydrated, without respiratory distress. Pulmonary exam is remarkable for lower lobe wheezes.  Character of wheeze changes with cough.  Patient's symptoms are consistent with an acute viral process.  Testing for influenza and COVID are offered.  POC influenza result is negative.  Recommending supportive care and continued use of OTC medication for symptom control including Mucinex.  Encouraged him to adequately hydrate.  Counseled patient on potential for adverse effects with medications prescribed/recommended today, ER and return-to-clinic precautions discussed, patient verbalized understanding and agreement with care plan.   Final Clinical Impressions(s) / UC Diagnoses   Final diagnoses:  None   Discharge Instructions   None    ED Prescriptions   None    PDMP not reviewed this encounter.   Charma Igo, [place] 06/18/22  [phone]

## 2022-06-18 NOTE — Discharge Instructions (Addendum)
Follow up here or with your primary care provider if your symptoms are worsening or not improving.     

## 2022-06-18 NOTE — ED Triage Notes (Signed)
Patient presents to UC for fever and productive cough x 2 days. Taking mucinex.

## 2022-09-14 ENCOUNTER — Encounter: Payer: Self-pay | Admitting: Family

## 2022-10-25 ENCOUNTER — Encounter: Payer: Self-pay | Admitting: Nurse Practitioner

## 2022-10-25 ENCOUNTER — Ambulatory Visit: Payer: BC Managed Care – PPO | Admitting: Nurse Practitioner

## 2022-10-25 VITALS — BP 124/78 | HR 100 | Temp 98.2°F | Ht 70.0 in | Wt 248.2 lb

## 2022-10-25 DIAGNOSIS — E559 Vitamin D deficiency, unspecified: Secondary | ICD-10-CM

## 2022-10-25 DIAGNOSIS — H538 Other visual disturbances: Secondary | ICD-10-CM | POA: Diagnosis not present

## 2022-10-25 DIAGNOSIS — Z8042 Family history of malignant neoplasm of prostate: Secondary | ICD-10-CM | POA: Diagnosis not present

## 2022-10-25 DIAGNOSIS — I252 Old myocardial infarction: Secondary | ICD-10-CM

## 2022-10-25 DIAGNOSIS — Z1329 Encounter for screening for other suspected endocrine disorder: Secondary | ICD-10-CM | POA: Diagnosis not present

## 2022-10-25 DIAGNOSIS — Z131 Encounter for screening for diabetes mellitus: Secondary | ICD-10-CM

## 2022-10-25 NOTE — Assessment & Plan Note (Signed)
Taking OTC vitamin D supplement. Will check vitamin D level.

## 2022-10-25 NOTE — Assessment & Plan Note (Addendum)
At age 37. Had negative cath. Last office visit with Cardiology- 11/2021. Denies any problems or symptoms. Lab work as outlined. Will contact patient with results. Return precautions given to patient.

## 2022-10-25 NOTE — Assessment & Plan Note (Addendum)
Evaluated by Ophthalmology, found to have astigmatism. Blurry vision has since resolved. Glasses ordered. Will check lab work today. Encouraged to follow up with Ophthalmology if symptoms return or are worsening.

## 2022-10-25 NOTE — Progress Notes (Signed)
Bethanie Dicker, NP-C Phone: [phone]  John Snow is a 37 y.o. male who presents today for blurry vision.   Patient with blurry vision in right eye on Friday. He was evaluated by Ophthalmology on Friday who found that he had an astigmatism in his right eye. He did order glasses at that time. He was advised to have updated blood work completed with his PCP to evaluate for diabetes. The blurry vision did resolve the next day. He has not had any problems since. He denies polydipsia and polyuria. Denies hypoglycemia.   Social History   Tobacco Use  Smoking Status Never  Smokeless Tobacco Never    Current Outpatient Medications on File Prior to Visit  Medication Sig Dispense Refill   Black Currant Seed Oil 500 MG CAPS Take by mouth daily.     cholecalciferol (VITAMIN D) 1000 units tablet Take 1,000 Units by mouth daily.     No current facility-administered medications on file prior to visit.    ROS see history of present illness  Objective  Physical Exam Vitals:   10/25/22 1456  BP: 124/78  Pulse: 100  Temp: 98.2 F (36.8 C)  SpO2: 98%    BP Readings from Last 3 Encounters:  10/25/22 124/78  06/18/22 (!) 156/75  01/05/22 128/78   Wt Readings from Last 3 Encounters:  10/25/22 248 lb 3.2 oz (112.6 kg)  01/05/22 247 lb 3.2 oz (112.1 kg)  11/15/21 244 lb (110.7 kg)    Physical Exam Constitutional:      General: He is not in acute distress.    Appearance: Normal appearance.  HENT:     Head: Normocephalic.  Cardiovascular:     Rate and Rhythm: Normal rate and regular rhythm.     Heart sounds: Normal heart sounds.  Pulmonary:     Effort: Pulmonary effort is normal.     Breath sounds: Normal breath sounds.  Skin:    General: Skin is warm and dry.  Neurological:     General: No focal deficit present.     Mental Status: He is alert.  Psychiatric:        Mood and Affect: Mood normal.        Behavior: Behavior normal.    Assessment/Plan: Please see  individual problem list.  Blurry vision, right eye Assessment & Plan: Evaluated by Ophthalmology, found to have astigmatism. Blurry vision has since resolved. Glasses ordered. Will check lab work today. Encouraged to follow up with Ophthalmology if symptoms return or are worsening.    History of MI (myocardial infarction) Assessment & Plan: At age 37. Had negative cath. Last office visit with Cardiology- 11/2021. Denies any problems or symptoms. Lab work as outlined. Will contact patient with results. Return precautions given to patient.   Orders: -     Lipid panel -     Comprehensive metabolic panel -     CBC with Differential/Platelet  Family history of prostate cancer in father Assessment & Plan: Asymptomatic. Will check PSA today.  Orders: -     PSA  Vitamin D deficiency Assessment & Plan: Taking OTC vitamin D supplement. Will check vitamin D level.   Orders: -     VITAMIN D 25 Hydroxy (Vit-D Deficiency, Fractures)  Diabetes mellitus screening -     Hemoglobin A1c  Thyroid disorder screen -     TSH   Return if symptoms worsen or fail to improve, for follow up with PCP.   Bethanie Dicker, NP-C [facility] - [employer]

## 2022-10-25 NOTE — Assessment & Plan Note (Signed)
Asymptomatic. Will check PSA today.

## 2022-10-26 LAB — CBC WITH DIFFERENTIAL/PLATELET
Basophils Absolute: 0 10*3/uL (ref 0.0–0.1)
Basophils Relative: 0.6 % (ref 0.0–3.0)
Eosinophils Absolute: 0.1 10*3/uL (ref 0.0–0.7)
Eosinophils Relative: 1.3 % (ref 0.0–5.0)
HCT: 48.3 % (ref 39.0–52.0)
Hemoglobin: 15.8 g/dL (ref 13.0–17.0)
Lymphocytes Relative: 24.6 % (ref 12.0–46.0)
Lymphs Abs: 2 10*3/uL (ref 0.7–4.0)
MCHC: 32.8 g/dL (ref 30.0–36.0)
MCV: 94.2 fl (ref 78.0–100.0)
Monocytes Absolute: 0.8 10*3/uL (ref 0.1–1.0)
Monocytes Relative: 10 % (ref 3.0–12.0)
Neutro Abs: 5.2 10*3/uL (ref 1.4–7.7)
Neutrophils Relative %: 63.5 % (ref 43.0–77.0)
Platelets: 274 10*3/uL (ref 150.0–400.0)
RBC: 5.12 Mil/uL (ref 4.22–5.81)
RDW: 13.3 % (ref 11.5–15.5)
WBC: 8.2 10*3/uL (ref 4.0–10.5)

## 2022-10-26 LAB — COMPREHENSIVE METABOLIC PANEL
ALT: 15 U/L (ref 0–53)
AST: 18 U/L (ref 0–37)
Albumin: 4.4 g/dL (ref 3.5–5.2)
Alkaline Phosphatase: 36 U/L — ABNORMAL LOW (ref 39–117)
BUN: 19 mg/dL (ref 6–23)
CO2: 27 meq/L (ref 19–32)
Calcium: 9.7 mg/dL (ref 8.4–10.5)
Chloride: 101 meq/L (ref 96–112)
Creatinine, Ser: 1.2 mg/dL (ref 0.40–1.50)
GFR: 76.83 mL/min (ref >60.00)
Glucose, Bld: 96 mg/dL (ref 70–99)
Potassium: 3.9 meq/L (ref 3.5–5.1)
Sodium: 138 meq/L (ref 135–145)
Total Bilirubin: 0.5 mg/dL (ref 0.2–1.2)
Total Protein: 7.5 g/dL (ref 6.0–8.3)

## 2022-10-26 LAB — LIPID PANEL
Cholesterol: 231 mg/dL — ABNORMAL HIGH (ref 0–200)
HDL: 64.9 mg/dL (ref >39.00)
LDL Cholesterol: 134 mg/dL — ABNORMAL HIGH (ref 0–99)
NonHDL: 165.9
Total CHOL/HDL Ratio: 4
Triglycerides: 161 mg/dL — ABNORMAL HIGH (ref 0.0–149.0)
VLDL: 32.2 mg/dL (ref 0.0–40.0)

## 2022-10-26 LAB — HEMOGLOBIN A1C: Hgb A1c MFr Bld: 5.5 % (ref 4.6–6.5)

## 2022-10-27 LAB — TSH: TSH: 0.93 u[IU]/mL (ref 0.35–5.50)

## 2022-10-27 LAB — VITAMIN D 25 HYDROXY (VIT D DEFICIENCY, FRACTURES): VITD: 29.03 ng/mL — ABNORMAL LOW (ref 30.00–100.00)

## 2022-10-27 LAB — PSA: PSA: 0.42 ng/mL (ref 0.10–4.00)

## 2022-11-01 ENCOUNTER — Encounter: Payer: Self-pay | Admitting: Family

## 2022-11-01 DIAGNOSIS — I252 Old myocardial infarction: Secondary | ICD-10-CM

## 2022-11-03 ENCOUNTER — Other Ambulatory Visit: Payer: Self-pay

## 2022-11-03 DIAGNOSIS — Z131 Encounter for screening for diabetes mellitus: Secondary | ICD-10-CM

## 2022-11-03 MED ORDER — ROSUVASTATIN CALCIUM 5 MG PO TABS: 5.0000 mg | ORAL_TABLET | Freq: Every day | ORAL | 3 refills | Status: DC

## 2022-11-03 NOTE — Telephone Encounter (Signed)
LVM to call back to schedule f/up cmp lab in 8 weeks after beginning medication and schedule f/up in 3 mnths with Lenise Herald crestor 5mg  po at bedtime for 90 day with refills

## 2022-11-07 NOTE — Telephone Encounter (Signed)
Pt has scheduled appt for labs and f/ up appt with Claris Che

## 2022-11-30 ENCOUNTER — Other Ambulatory Visit: Payer: Self-pay

## 2022-11-30 ENCOUNTER — Encounter: Payer: Self-pay | Admitting: Emergency Medicine

## 2022-11-30 DIAGNOSIS — N390 Urinary tract infection, site not specified: Secondary | ICD-10-CM | POA: Diagnosis not present

## 2022-11-30 DIAGNOSIS — R300 Dysuria: Secondary | ICD-10-CM | POA: Diagnosis not present

## 2022-11-30 DIAGNOSIS — M545 Low back pain, unspecified: Secondary | ICD-10-CM | POA: Diagnosis not present

## 2022-11-30 LAB — URINALYSIS, ROUTINE W REFLEX MICROSCOPIC
Bacteria, UA: NONE SEEN
Bilirubin Urine: NEGATIVE
Glucose, UA: NEGATIVE mg/dL
Ketones, ur: 5 mg/dL — AB
Nitrite: NEGATIVE
Protein, ur: 100 mg/dL — AB
RBC / HPF: >50 RBC/hpf (ref 0–5)
Specific Gravity, Urine: 1.01 (ref 1.005–1.030)
WBC, UA: >50 WBC/hpf (ref 0–5)
pH: 5 (ref 5.0–8.0)

## 2022-11-30 NOTE — ED Triage Notes (Signed)
Pt presents ambulatory to triage via POV with complaints of lower back pain and dysuria x 3 days. Pt states the pain is 7/10 with burning sensation to his groin. A&Ox4 at this time. Denies hematuria, CP or SOB.

## 2022-12-01 ENCOUNTER — Emergency Department: Payer: BC Managed Care – PPO

## 2022-12-01 ENCOUNTER — Emergency Department
Admission: EM | Admit: 2022-12-01 | Discharge: 2022-12-01 | Disposition: A | Discharge status: Home / Self Care | Payer: BC Managed Care – PPO | Attending: Emergency Medicine | Admitting: Emergency Medicine

## 2022-12-01 DIAGNOSIS — R300 Dysuria: Secondary | ICD-10-CM | POA: Diagnosis not present

## 2022-12-01 DIAGNOSIS — N390 Urinary tract infection, site not specified: Secondary | ICD-10-CM

## 2022-12-01 DIAGNOSIS — M545 Low back pain, unspecified: Secondary | ICD-10-CM | POA: Diagnosis not present

## 2022-12-01 LAB — CBC WITH DIFFERENTIAL/PLATELET
Abs Immature Granulocytes: 0.05 10*3/uL (ref 0.00–0.07)
Basophils Absolute: 0 10*3/uL (ref 0.0–0.1)
Basophils Relative: 0 %
Eosinophils Absolute: 0 10*3/uL (ref 0.0–0.5)
Eosinophils Relative: 0 %
HCT: 49.7 % (ref 39.0–52.0)
Hemoglobin: 16.2 g/dL (ref 13.0–17.0)
Immature Granulocytes: 0 %
Lymphocytes Relative: 9 %
Lymphs Abs: 1.3 10*3/uL (ref 0.7–4.0)
MCH: 30.5 pg (ref 26.0–34.0)
MCHC: 32.6 g/dL (ref 30.0–36.0)
MCV: 93.6 fL (ref 80.0–100.0)
Monocytes Absolute: 1.2 10*3/uL — ABNORMAL HIGH (ref 0.1–1.0)
Monocytes Relative: 9 %
Neutro Abs: 11 10*3/uL — ABNORMAL HIGH (ref 1.7–7.7)
Neutrophils Relative %: 82 %
Platelets: 239 10*3/uL (ref 150–400)
RBC: 5.31 MIL/uL (ref 4.22–5.81)
RDW: 12.5 % (ref 11.5–15.5)
WBC: 13.6 10*3/uL — ABNORMAL HIGH (ref 4.0–10.5)
nRBC: 0 % (ref 0.0–0.2)

## 2022-12-01 LAB — BASIC METABOLIC PANEL
Anion gap: 13 (ref 5–15)
BUN: 12 mg/dL (ref 6–20)
CO2: 24 mmol/L (ref 22–32)
Calcium: 9.2 mg/dL (ref 8.9–10.3)
Chloride: 98 mmol/L (ref 98–111)
Creatinine, Ser: 0.94 mg/dL (ref 0.61–1.24)
GFR, Estimated: >60 mL/min (ref >60)
Glucose, Bld: 103 mg/dL — ABNORMAL HIGH (ref 70–99)
Potassium: 4 mmol/L (ref 3.5–5.1)
Sodium: 135 mmol/L (ref 135–145)

## 2022-12-01 LAB — CHLAMYDIA/NGC RT PCR (ARMC ONLY)
Chlamydia Tr: NOT DETECTED
N gonorrhoeae: NOT DETECTED

## 2022-12-01 LAB — LACTIC ACID, PLASMA: Lactic Acid, Venous: 1.1 mmol/L (ref 0.5–1.9)

## 2022-12-01 MED ORDER — KETOROLAC TROMETHAMINE 30 MG/ML IJ SOLN
15.0000 mg | Freq: Once | INTRAMUSCULAR | Status: AC
  Administered 2022-12-01: 15 mg via INTRAVENOUS
  Filled 2022-12-01: qty 1

## 2022-12-01 MED ORDER — SODIUM CHLORIDE 0.9 % IV SOLN
1.0000 g | Freq: Once | INTRAVENOUS | Status: AC
  Administered 2022-12-01: 1 g via INTRAVENOUS
  Filled 2022-12-01: qty 10

## 2022-12-01 MED ORDER — CEPHALEXIN 500 MG PO CAPS: 500.0000 mg | ORAL_CAPSULE | Freq: Three times a day (TID) | ORAL | 0 refills | Status: DC

## 2022-12-01 MED ORDER — SODIUM CHLORIDE 0.9 % IV BOLUS
1000.0000 mL | Freq: Once | INTRAVENOUS | Status: AC
  Administered 2022-12-01: 1000 mL via INTRAVENOUS

## 2022-12-01 MED ORDER — PHENAZOPYRIDINE HCL 200 MG PO TABS: 200.0000 mg | ORAL_TABLET | Freq: Three times a day (TID) | ORAL | 0 refills | Status: DC | PRN

## 2022-12-01 NOTE — ED Provider Notes (Signed)
[facility] Provider Note    Event Date/Time   First MD Initiated Contact with Patient 12/01/22 0112     (approximate)   History   Back Pain   HPI  John Snow is a 37 y.o. male who presents to the ED from home with a chief complaint of dysuria and right lower back pain x 3 days.  Patient drove 13 hours on a motorcycle from [place] prior to onset of symptoms.  Endorses fever to 100.5 F with chills.  Denies cough, chest pain, shortness of breath, abdominal pain, nausea, vomiting or dizziness.  Denies testicular pain/swelling, urethral discharge.  Denies STD concerns.     Past Medical History   Past Medical History:  Diagnosis Date   Chest pain    a. 2007 Cath in setting of chest pain and early repol on ECG: reportedly nl cors.   Early Repolarization on ECG    GERD (gastroesophageal reflux disease)    History of echocardiogram    a. 08/2015 Echo: EF 55-60%, no rwma. Nl RV fxn; b. 09/2019 Echo: EF 55-60%, no rwma, nl RV size/fxn.   Morbid obesity (HCC)    Palpitations    a. 03/2020 Zio: RSR, avg HR 74. rare PACs/PVCs <1% burden. Most triggered events did not correlate with arrhythmia.  Some correlated with isolated PVCs.  No significant arrhythmias.     Active Problem List   Patient Active Problem List   Diagnosis Date Noted   Blurry vision, right eye 10/25/2022   Vitamin D deficiency 10/25/2022   Exposure to herpes simplex virus (HSV) 01/05/2022   Right ear pain 02/02/2021   Encounter for general adult medical examination with abnormal findings 09/25/2018   Groin pain 01/14/2018   Acute right-sided thoracic back pain 11/13/2017   Acute lumbar back pain 11/13/2017   Alopecia 10/01/2017   Hyperpigmented skin lesion 07/01/2017   Plantar wart of left foot 07/01/2017   GERD (gastroesophageal reflux disease) 04/26/2017   Hypersomnia 12/15/2016   Allergic rhinitis 12/27/2015   Shift work sleep disorder 12/27/2015   Family history of prostate  cancer in father 12/27/2015   Palpitations 07/26/2015   History of MI (myocardial infarction) 05/28/2015   Obesity (BMI 30-39.9) 05/28/2015     Past Surgical History  History reviewed. No pertinent surgical history.   Home Medications   Prior to Admission medications   Medication Sig Start Date End Date Taking? Authorizing Provider  Black Currant Seed Oil 500 MG CAPS Take by mouth daily.    [provider]  cholecalciferol (VITAMIN D) 1000 units tablet Take 1,000 Units by mouth daily.    [provider]  rosuvastatin (CRESTOR) 5 MG tablet Take 1 tablet (5 mg total) by mouth daily. 11/03/22   Allegra Grana, FNP     Allergies  Decongestant [pseudoephedrine hcl]   Family History   Family History  Problem Relation Age of Onset   Cancer Father 58       prostate cancer mets to bone   Hypertension Maternal Grandmother      Physical Exam  Triage Vital Signs: ED Triage Vitals  Encounter Vitals Group     BP 11/30/22 2300 (!) 140/88     Systolic BP Percentile --      Diastolic BP Percentile --      Pulse Rate 11/30/22 2300 92     Resp 11/30/22 2300 18     Temp 11/30/22 2300 99 F (37.2 C)     Temp Source 11/30/22  2300 Oral     SpO2 11/30/22 2300 100 %     Weight 11/30/22 2300 230 lb (104.3 kg)     Height 11/30/22 2300 5\' 10"  (1.778 m)     Head Circumference --      Peak Flow --      Pain Score 11/30/22 2301 7     Pain Loc --      Pain Education --      Exclude from Growth Chart --     Updated Vital Signs: BP 134/81   Pulse 100   Temp 98.4 F (36.9 C) (Oral)   Resp 18   Ht 5\' 10"  (1.778 m)   Wt 104.3 kg   SpO2 97%   BMI 33.00 kg/m    General: Awake, no distress.  CV:  RRR.  Good peripheral perfusion.  Resp:  Normal effort.  CTAB. Abd:  Nontender to light or deep palpation.  No CVAT.  No truncal vesicles.  No distention.  Other:  GU: Circumcised male.  No urethral discharge.  Bilaterally distended testicles which are nontender and  nonswollen.  Strong bilateral cremasteric reflexes.   ED Results / Procedures / Treatments  Labs (all labs ordered are listed, but only abnormal results are displayed) Labs Reviewed  URINALYSIS, ROUTINE W REFLEX MICROSCOPIC - Abnormal; Notable for the following components:      Result Value   Color, Urine YELLOW (*)    APPearance CLOUDY (*)    Hgb urine dipstick LARGE (*)    Ketones, ur 5 (*)    Protein, ur 100 (*)    Leukocytes,Ua LARGE (*)    Non Squamous Epithelial PRESENT (*)    All other components within normal limits  CBC WITH DIFFERENTIAL/PLATELET - Abnormal; Notable for the following components:   WBC 13.6 (*)    Neutro Abs 11.0 (*)    Monocytes Absolute 1.2 (*)    All other components within normal limits  BASIC METABOLIC PANEL - Abnormal; Notable for the following components:   Glucose, Bld 103 (*)    All other components within normal limits  CULTURE, BLOOD (ROUTINE X 2)  URINE CULTURE  CHLAMYDIA/NGC RT PCR ([facility] ONLY)            CULTURE, BLOOD (ROUTINE X 2) W REFLEX TO ID PANEL  LACTIC ACID, PLASMA  LACTIC ACID, PLASMA     EKG  None   RADIOLOGY Independently visualized interpreted patient's imaging study as well as noted the radiology interpretation:  CT renal stone: No acute abnormality  Official radiology report(s): CT Renal Stone Study  Result Date: 12/01/2022 CLINICAL DATA:  Lower back pain and dysuria for 3 days. EXAM: CT ABDOMEN AND PELVIS WITHOUT CONTRAST TECHNIQUE: Multidetector CT imaging of the abdomen and pelvis was performed following the standard protocol without IV contrast. RADIATION DOSE REDUCTION: This exam was performed according to the departmental dose-optimization program which includes automated exposure control, adjustment of the mA and/or kV according to patient size and/or use of iterative reconstruction technique. COMPARISON:  None Available. FINDINGS: Lower chest: No acute abnormality. Hepatobiliary: Unremarkable liver. Normal  gallbladder. No biliary dilation. Pancreas: Unremarkable. Spleen: Unremarkable. Adrenals/Urinary Tract: Normal adrenal glands. No urinary calculi or hydronephrosis. Bladder is unremarkable. Stomach/Bowel: Normal caliber large and small bowel. No bowel wall thickening. The appendix is not definitively visualized. No secondary signs of appendicitis. Stomach is within normal limits. Vascular/Lymphatic: No significant vascular findings are present. No enlarged abdominal or pelvic lymph nodes. Reproductive: Unremarkable. Other: No free intraperitoneal fluid or  air. Musculoskeletal: No acute fracture. IMPRESSION: No acute abnormality in the abdomen or pelvis. Electronically Signed   By: Minerva Fester M.D.   On: 12/01/2022 03:29     PROCEDURES:  Critical Care performed: No  Procedures   MEDICATIONS ORDERED IN ED: Medications  sodium chloride 0.9 % bolus 1,000 mL (0 mLs Intravenous Stopped 12/01/22 0310)  cefTRIAXone (ROCEPHIN) 1 g in sodium chloride 0.9 % 100 mL IVPB (0 g Intravenous Stopped 12/01/22 0223)  ketorolac (TORADOL) 30 MG/ML injection 15 mg (15 mg Intravenous Given 12/01/22 0307)     IMPRESSION / MDM / ASSESSMENT AND PLAN / ED COURSE  I reviewed the triage vital signs and the nursing notes.                             37 year old male presenting with dysuria, fever and chills. Differential diagnosis includes, but is not limited to, acute appendicitis, renal colic, testicular torsion, urinary tract infection/pyelonephritis, prostatitis,  epididymitis, diverticulitis, small bowel obstruction or ileus, colitis, abdominal aortic aneurysm, gastroenteritis, hernia, etc. I personally viewed patient's records and note a PCP office visit on 10/25/2022 for blurry vision, vitamin D deficiency, history of MI, diabetes and thyroid screening.  Patient's presentation is most consistent with acute presentation with potential threat to life or bodily function.  UA positive for large leukocytes, greater  than 50 WBC and RBC.  Given patient's complaint of fever and chills, will obtain sepsis workup with blood cultures, lactic acid, basic lab work.  Will obtain CT renal stone study.  Initiate IV fluid hydration, IV ceftriaxone.  Will reassess.  Clinical Course as of 12/01/22 0338  Fri Dec 01, 2022  1610 Lactic acid negative.  CT negative.  Will discharge home on Keflex and patient will follow-up closely with his PCP.  Strict return precautions given.  Patient verbalizes understanding and agrees with plan of care. [JS]    Clinical Course User Index [JS] Irean Hong, MD     FINAL CLINICAL IMPRESSION(S) / ED DIAGNOSES   Final diagnoses:  Dysuria  Lower urinary tract infectious disease     Rx / DC Orders   ED Discharge Orders     None        Note:  This document was prepared using Dragon voice recognition software and may include unintentional dictation errors.   Irean Hong, MD 12/01/22 [phone]

## 2022-12-01 NOTE — ED Notes (Signed)
Patient taken for CT at this time

## 2022-12-01 NOTE — Discharge Instructions (Addendum)
Take and finish antibiotic as prescribed.  Take Pyridium for symptoms of urinary discomfort.  Urine culture is pending.  We will notify you if we need to change your antibiotic.  Return to the ER for worsening symptoms, persistent vomiting, difficulty breathing or other concerns.

## 2022-12-01 NOTE — ED Notes (Signed)
Patient given discharge instructions including prescriptions x2 and importance of follow up appt with stated understanding. INT removed, cannula intact, pressure dressing applied. Patient stable and ambulatory with steady even gait on dispo.

## 2022-12-03 LAB — URINE CULTURE: Culture: >=100000 — AB

## 2022-12-06 LAB — CULTURE, BLOOD (ROUTINE X 2)
Culture: NO GROWTH
Culture: NO GROWTH
Special Requests: ADEQUATE
Special Requests: ADEQUATE

## 2022-12-18 ENCOUNTER — Ambulatory Visit
Admission: RE | Admit: 2022-12-18 | Discharge: 2022-12-18 | Disposition: A | Discharge status: Home / Self Care | Payer: BC Managed Care – PPO | Source: Ambulatory Visit | Attending: Physician Assistant | Admitting: Physician Assistant

## 2022-12-18 VITALS — BP 144/83 | HR 100 | Temp 98.1°F | Resp 17

## 2022-12-18 DIAGNOSIS — R350 Frequency of micturition: Secondary | ICD-10-CM | POA: Diagnosis not present

## 2022-12-18 DIAGNOSIS — Z87440 Personal history of urinary (tract) infections: Secondary | ICD-10-CM | POA: Diagnosis not present

## 2022-12-18 DIAGNOSIS — R8290 Unspecified abnormal findings in urine: Secondary | ICD-10-CM | POA: Insufficient documentation

## 2022-12-18 LAB — POCT URINALYSIS DIP (MANUAL ENTRY)
Bilirubin, UA: NEGATIVE
Glucose, UA: NEGATIVE mg/dL
Ketones, POC UA: NEGATIVE mg/dL
Nitrite, UA: NEGATIVE
Protein Ur, POC: NEGATIVE mg/dL
Spec Grav, UA: <=1.005 — AB (ref 1.010–1.025)
Urobilinogen, UA: 0.2 U/dL
pH, UA: 5.5 (ref 5.0–8.0)

## 2022-12-18 MED ORDER — CIPROFLOXACIN HCL 500 MG PO TABS: 500.0000 mg | ORAL_TABLET | Freq: Two times a day (BID) | ORAL | 0 refills | Status: DC

## 2022-12-18 NOTE — ED Triage Notes (Signed)
Patient to Urgent Care with complaints of foul odor to urine/ dysuria. Denies any fevers. Denies any concerns for STDs.   Symptoms started 2 days ago. Reports he was fasting at the time.

## 2022-12-18 NOTE — Discharge Instructions (Signed)
Your urine did have some white blood cells which could be a sign of an infection.  Given your history we are starting an antibiotic (ciprofloxacin) twice daily for a week.  This medication can have side effects so take it with food.  If you develop any muscle or joint pain please stop the medication to be seen immediately.  Given your history I do recommend that you follow-up with urology.  Call them to schedule an appointment as soon as possible.  If anything worsens and you have fever, nausea, vomiting, worsening symptoms you need to be seen immediately.  We will contact you if we need to change her treatment based on your culture results.

## 2022-12-18 NOTE — ED Provider Notes (Signed)
John Snow    CSN: [id number] Arrival date & time: 12/18/22  1556      History   Chief Complaint Chief Complaint  Patient presents with   Urinary Frequency    HPI John Snow is a 37 y.o. male.   Patient presents today with a 2-day history of malodorous urine.  He reports initially having some frequency as well as a day of dysuria but this is since improved.  He was seen in the emergency room on 12/01/2022 at which point he had negative CT scan but was diagnosed with a UTI.  He was prescribed cephalexin reports completing course of medication with improvement of symptoms.  He has never seen urology.  He was tested for STIs at the time of his emergency room visit and was negative.  He has no concern for STI and declines additional testing today.  Denies any penile discharge or genital lesions.  He denies history of nephrolithiasis.  Denies any recent urogenital procedure or catheterization.  He is anxious to rule out a urinary tract infection given how sick he got last time.  Denies history of diabetes and does not take SGLT2 inhibitor.    Past Medical History:  Diagnosis Date   Chest pain    a. 2007 Cath in setting of chest pain and early repol on ECG: reportedly nl cors.   Early Repolarization on ECG    GERD (gastroesophageal reflux disease)    History of echocardiogram    a. 08/2015 Echo: EF 55-60%, no rwma. Nl RV fxn; b. 09/2019 Echo: EF 55-60%, no rwma, nl RV size/fxn.   Morbid obesity (HCC)    Palpitations    a. 03/2020 Zio: RSR, avg HR 74. rare PACs/PVCs <1% burden. Most triggered events did not correlate with arrhythmia.  Some correlated with isolated PVCs.  No significant arrhythmias.    Patient Active Problem List   Diagnosis Date Noted   Blurry vision, right eye 10/25/2022   Vitamin D deficiency 10/25/2022   Exposure to herpes simplex virus (HSV) 01/05/2022   Right ear pain 02/02/2021   Encounter for general adult medical examination with abnormal  findings 09/25/2018   Groin pain 01/14/2018   Acute right-sided thoracic back pain 11/13/2017   Acute lumbar back pain 11/13/2017   Alopecia 10/01/2017   Hyperpigmented skin lesion 07/01/2017   Plantar wart of left foot 07/01/2017   GERD (gastroesophageal reflux disease) 04/26/2017   Hypersomnia 12/15/2016   Allergic rhinitis 12/27/2015   Shift work sleep disorder 12/27/2015   Family history of prostate cancer in father 12/27/2015   Palpitations 07/26/2015   History of MI (myocardial infarction) 05/28/2015   Obesity (BMI 30-39.9) 05/28/2015    History reviewed. No pertinent surgical history.     Home Medications    Prior to Admission medications   Medication Sig Start Date End Date Taking? Authorizing Provider  ciprofloxacin (CIPRO) 500 MG tablet Take 1 tablet (500 mg total) by mouth 2 (two) times daily. 12/18/22  Yes Krithik Mapel, Noberto Retort, PA-C  Black Currant Seed Oil 500 MG CAPS Take by mouth daily.    [provider]  cholecalciferol (VITAMIN D) 1000 units tablet Take 1,000 Units by mouth daily.    [provider]  rosuvastatin (CRESTOR) 5 MG tablet Take 1 tablet (5 mg total) by mouth daily. 11/03/22   Allegra Grana, FNP    Family History Family History  Problem Relation Age of Onset   Cancer Father 75       prostate  cancer mets to bone   Hypertension Maternal Grandmother     Social History Social History   Tobacco Use   Smoking status: Never   Smokeless tobacco: Never  Vaping Use   Vaping status: Never Used  Substance Use Topics   Alcohol use: Yes    Alcohol/week: 0.0 standard drinks of alcohol    Comment: occasional   Drug use: No     Allergies   Decongestant [pseudoephedrine hcl]   Review of Systems Review of Systems  Constitutional:  Negative for activity change, appetite change, fatigue and fever.  Gastrointestinal:  Negative for abdominal pain, diarrhea, nausea and vomiting.  Genitourinary:  Positive for frequency. Negative for  dysuria, flank pain, genital sores, hematuria, penile pain and urgency.     Physical Exam Triage Vital Signs ED Triage Vitals  Encounter Vitals Group     BP 12/18/22 1614 (!) 144/83     Systolic BP Percentile --      Diastolic BP Percentile --      Pulse Rate 12/18/22 1614 100     Resp 12/18/22 1614 17     Temp 12/18/22 1614 98.1 F (36.7 C)     Temp src --      SpO2 12/18/22 1614 97 %     Weight --      Height --      Head Circumference --      Peak Flow --      Pain Score 12/18/22 1613 0     Pain Loc --      Pain Education --      Exclude from Growth Chart --    No data found.  Updated Vital Signs BP (!) 144/83   Pulse 100   Temp 98.1 F (36.7 C)   Resp 17   SpO2 97%   Visual Acuity Right Eye Distance:   Left Eye Distance:   Bilateral Distance:    Right Eye Near:   Left Eye Near:    Bilateral Near:     Physical Exam Vitals reviewed.  Constitutional:      General: He is awake.     Appearance: Normal appearance. He is well-developed. He is not ill-appearing.     Comments: Very pleasant male appears stated age in no acute distress sitting comfortably in exam room  HENT:     Head: Normocephalic and atraumatic.     Mouth/Throat:     Pharynx: No oropharyngeal exudate, posterior oropharyngeal erythema or uvula swelling.  Cardiovascular:     Rate and Rhythm: Normal rate and regular rhythm.     Heart sounds: Normal heart sounds, S1 normal and S2 normal. No murmur heard. Pulmonary:     Effort: Pulmonary effort is normal.     Breath sounds: Normal breath sounds. No stridor. No wheezing, rhonchi or rales.     Comments: Clear to auscultation bilaterally Abdominal:     General: Bowel sounds are normal.     Palpations: Abdomen is soft.     Tenderness: There is no abdominal tenderness. There is no right CVA tenderness, left CVA tenderness, guarding or rebound.     Comments: Benign abdominal exam  Neurological:     Mental Status: He is alert.  Psychiatric:         Behavior: Behavior is cooperative.      UC Treatments / Results  Labs (all labs ordered are listed, but only abnormal results are displayed) Labs Reviewed  POCT URINALYSIS DIP (MANUAL ENTRY) - Abnormal; Notable for the  following components:      Result Value   Color, UA colorless (*)    Spec Grav, UA <=1.005 (*)    Blood, UA trace-intact (*)    Leukocytes, UA Small (1+) (*)    All other components within normal limits  URINE CULTURE    EKG   Radiology No results found.  Procedures Procedures (including critical care time)  Medications Ordered in UC Medications - No data to display  Initial Impression / Assessment and Plan / UC Course  I have reviewed the triage vital signs and the nursing notes.  Pertinent labs & imaging results that were available during my care of the patient were reviewed by me and considered in my medical decision making (see chart for details).     Patient is well-appearing, afebrile, nontoxic, nontachycardic.  UA did show blood as well as trace leukocytes.  Given he is symptomatic we will treat with ciprofloxacin (last urine culture was sensitive to this medication).  We discussed that he should take this with food to prevent GI side effects and if he develops any side effects including joint or muscle pain he needs to stop the medication to be seen immediately.  Recommended that he continue pushing fluids.  Will send this for culture and contact him if we need to change or discontinue his antibiotics.  We discussed that given he is a male who has had a urinary tract infection I recommend he follow-up with urology.  He was given the contact information for local provider with instruction to call to schedule appointment soon as possible.  If he has any worsening or changing symptoms including abdominal pain, blood in his urine, fever, nausea/vomiting he needs to be seen emergently.  Strict return precautions given.  All questions answered to patient  satisfaction.  Work excuse note provided.  Final Clinical Impressions(s) / UC Diagnoses   Final diagnoses:  Urinary frequency  History of UTI  Abnormal urinalysis     Discharge Instructions      Your urine did have some white blood cells which could be a sign of an infection.  Given your history we are starting an antibiotic (ciprofloxacin) twice daily for a week.  This medication can have side effects so take it with food.  If you develop any muscle or joint pain please stop the medication to be seen immediately.  Given your history I do recommend that you follow-up with urology.  Call them to schedule an appointment as soon as possible.  If anything worsens and you have fever, nausea, vomiting, worsening symptoms you need to be seen immediately.  We will contact you if we need to change her treatment based on your culture results.     ED Prescriptions     Medication Sig Dispense Auth. Provider   ciprofloxacin (CIPRO) 500 MG tablet Take 1 tablet (500 mg total) by mouth 2 (two) times daily. 14 tablet Sundiata Ferrick, Noberto Retort, PA-C      PDMP not reviewed this encounter.   Jeani Hawking, PA-C 12/18/22 [id number]

## 2022-12-20 LAB — URINE CULTURE: Culture: 80000 — AB

## 2022-12-21 ENCOUNTER — Ambulatory Visit: Payer: BC Managed Care – PPO | Admitting: Family

## 2022-12-21 ENCOUNTER — Encounter: Payer: Self-pay | Admitting: Family

## 2022-12-21 VITALS — BP 128/88 | HR 93 | Temp 98.0°F | Ht 70.0 in | Wt 244.8 lb

## 2022-12-21 DIAGNOSIS — N3001 Acute cystitis with hematuria: Secondary | ICD-10-CM

## 2022-12-21 DIAGNOSIS — N390 Urinary tract infection, site not specified: Secondary | ICD-10-CM | POA: Insufficient documentation

## 2022-12-21 NOTE — Progress Notes (Signed)
Assessment & Plan:  Acute cystitis with hematuria Assessment & Plan: Reviewed emergency room visits, labs and imaging with patient.  Fortunately he is asymptomatic today.  We agreed he would complete ciprofloxacin and then come back in the office for repeat urine, labs.  At that point we will discuss whether referral to urology is necessary at that time.  Encouraged probiotics.   Orders: -     CBC with Differential/Platelet; Future -     Urinalysis, Routine w reflex microscopic; Future -     Urine Culture; Future     Return precautions given.   Risks, benefits, and alternatives of the medications and treatment plan prescribed today were discussed, and patient expressed understanding.   Education regarding symptom management and diagnosis given to patient on AVS either electronically or printed.  Return in about 6 months (around 06/21/2023) for Complete Physical Exam.  [name], FNP I have spent 25 minutes with a patient including precharting, exam, reviewing medical records, and discussion plan of care.     Subjective:    Patient ID: John Snow, male    DOB: 01/16/1986, 37 y.o.   MRN: [id number]  CC: John Snow is a 37 y.o. male who presents today for follow up.   HPI: Feels well today.  Here to discuss recurrent UTIs.  compliant with Cipro.  Denies difficulty urinating, hesitancy, dysuria, penile discharge.   Follow up ED for dysuria 12/18/22  Trace leukocytes.  Urine culture with e coli , sensitive to cipro. He was treated with ciprofloxacin. Previously seen emergency room 12/01/2022, prescribed Keflex with improvement of symptoms.  UA showed protein, large leukocytes, large amount of blood, large WBCs. Urine culture with e coli, pansensitive; negative chlamydia, gonorrheae Blood culture negative CT renal 12/01/2022 without acute abnormality in the abdomen or pelvis.  No urinary calculi or hydronephrosis.  Bladder is unremarkable.   No history of  nephrolithiasis.  No history of diabetes.  He does not have a concern for STI at this time   Allergies: Decongestant [pseudoephedrine hcl] Current Outpatient Medications on File Prior to Visit  Medication Sig Dispense Refill   Black Currant Seed Oil 500 MG CAPS Take by mouth daily.     ciprofloxacin (CIPRO) 500 MG tablet Take 1 tablet (500 mg total) by mouth 2 (two) times daily. 14 tablet 0   cholecalciferol (VITAMIN D) 1000 units tablet Take 1,000 Units by mouth daily. (Patient not taking: Reported on 12/21/2022)     No current facility-administered medications on file prior to visit.    Review of Systems  Constitutional:  Negative for chills and fever.  Respiratory:  Negative for cough.   Cardiovascular:  Negative for chest pain and palpitations.  Gastrointestinal:  Negative for abdominal pain, nausea, rectal pain and vomiting.  Genitourinary:  Negative for decreased urine volume, difficulty urinating, penile pain and urgency.      Objective:    BP 128/88   Pulse 93   Temp 98 F (36.7 C) (Oral)   Ht 5\' 10"  (1.778 m)   Wt 244 lb 12.8 oz (111 kg)   SpO2 97%   BMI 35.13 kg/m  BP Readings from Last 3 Encounters:  12/21/22 128/88  12/18/22 (!) 144/83  12/01/22 111/77   Wt Readings from Last 3 Encounters:  12/21/22 244 lb 12.8 oz (111 kg)  11/30/22 230 lb (104.3 kg)  10/25/22 248 lb 3.2 oz (112.6 kg)    Physical Exam Vitals reviewed.  Constitutional:      Appearance: Normal appearance.  He is well-developed.  Cardiovascular:     Rate and Rhythm: Regular rhythm.     Heart sounds: Normal heart sounds.  Pulmonary:     Effort: Pulmonary effort is normal. No respiratory distress.     Breath sounds: Normal breath sounds. No wheezing or rales.  Abdominal:     General: Bowel sounds are normal. There is no distension.     Palpations: Abdomen is soft. Abdomen is not rigid. There is no fluid wave or mass.     Tenderness: There is no abdominal tenderness. There is no guarding  or rebound. Negative signs include Murphy's sign and McBurney's sign.     Comments: No suprapubic tenderness.   Skin:    General: Skin is warm and dry.  Neurological:     Mental Status: He is alert.  Psychiatric:        Speech: Speech normal.        Behavior: Behavior normal.

## 2022-12-21 NOTE — Patient Instructions (Signed)
Please complete antibiotic, ciprofloxacin.  Please start probiotics as well as discussed We will recheck urine studies, CBC lab in a couple weeks to ensure everything is clear.    We will also discuss at that point whether referral to urology is appropriate.

## 2022-12-21 NOTE — Assessment & Plan Note (Signed)
Reviewed emergency room visits, labs and imaging with patient.  Fortunately he is asymptomatic today.  We agreed he would complete ciprofloxacin and then come back in the office for repeat urine, labs.  At that point we will discuss whether referral to urology is necessary at that time.  Encouraged probiotics.

## 2022-12-22 ENCOUNTER — Encounter: Payer: Self-pay | Admitting: Family

## 2022-12-29 ENCOUNTER — Other Ambulatory Visit: Payer: BC Managed Care – PPO

## 2023-01-04 ENCOUNTER — Other Ambulatory Visit: Payer: BC Managed Care – PPO

## 2023-01-04 DIAGNOSIS — N3001 Acute cystitis with hematuria: Secondary | ICD-10-CM | POA: Diagnosis not present

## 2023-01-04 DIAGNOSIS — Z131 Encounter for screening for diabetes mellitus: Secondary | ICD-10-CM

## 2023-01-04 LAB — COMPREHENSIVE METABOLIC PANEL
ALT: 13 U/L (ref 0–53)
AST: 19 U/L (ref 0–37)
Albumin: 4.8 g/dL (ref 3.5–5.2)
Alkaline Phosphatase: 39 U/L (ref 39–117)
BUN: 16 mg/dL (ref 6–23)
CO2: 27 meq/L (ref 19–32)
Calcium: 9.8 mg/dL (ref 8.4–10.5)
Chloride: 100 meq/L (ref 96–112)
Creatinine, Ser: 1.18 mg/dL (ref 0.40–1.50)
GFR: 78.29 mL/min (ref >60.00)
Glucose, Bld: 128 mg/dL — ABNORMAL HIGH (ref 70–99)
Potassium: 4.4 meq/L (ref 3.5–5.1)
Sodium: 137 meq/L (ref 135–145)
Total Bilirubin: 0.7 mg/dL (ref 0.2–1.2)
Total Protein: 8.1 g/dL (ref 6.0–8.3)

## 2023-01-04 LAB — CBC WITH DIFFERENTIAL/PLATELET
Basophils Absolute: 0 10*3/uL (ref 0.0–0.1)
Basophils Relative: 0.6 % (ref 0.0–3.0)
Eosinophils Absolute: 0 10*3/uL (ref 0.0–0.7)
Eosinophils Relative: 0.6 % (ref 0.0–5.0)
HCT: 52.1 % — ABNORMAL HIGH (ref 39.0–52.0)
Hemoglobin: 16.7 g/dL (ref 13.0–17.0)
Lymphocytes Relative: 17.1 % (ref 12.0–46.0)
Lymphs Abs: 1 10*3/uL (ref 0.7–4.0)
MCHC: 32 g/dL (ref 30.0–36.0)
MCV: 94.4 fL (ref 78.0–100.0)
Monocytes Absolute: 0.4 10*3/uL (ref 0.1–1.0)
Monocytes Relative: 6.8 % (ref 3.0–12.0)
Neutro Abs: 4.3 10*3/uL (ref 1.4–7.7)
Neutrophils Relative %: 74.9 % (ref 43.0–77.0)
Platelets: 239 10*3/uL (ref 150.0–400.0)
RBC: 5.52 Mil/uL (ref 4.22–5.81)
RDW: 13.8 % (ref 11.5–15.5)
WBC: 5.7 10*3/uL (ref 4.0–10.5)

## 2023-01-04 LAB — URINALYSIS, ROUTINE W REFLEX MICROSCOPIC
Bilirubin Urine: NEGATIVE
Ketones, ur: NEGATIVE
Leukocytes,Ua: NEGATIVE
Nitrite: NEGATIVE
Specific Gravity, Urine: 1.025 (ref 1.000–1.030)
Total Protein, Urine: NEGATIVE
Urine Glucose: NEGATIVE
Urobilinogen, UA: 0.2 (ref 0.0–1.0)
pH: 6 (ref 5.0–8.0)

## 2023-01-05 ENCOUNTER — Encounter: Payer: Self-pay | Admitting: Family

## 2023-01-05 LAB — URINE CULTURE
MICRO NUMBER:: 15669976
Result:: NO GROWTH
SPECIMEN QUALITY:: ADEQUATE

## 2023-01-08 ENCOUNTER — Encounter: Payer: BC Managed Care – PPO | Admitting: Family

## 2023-02-05 ENCOUNTER — Ambulatory Visit (INDEPENDENT_AMBULATORY_CARE_PROVIDER_SITE_OTHER): Payer: BC Managed Care – PPO | Admitting: Family

## 2023-02-05 ENCOUNTER — Encounter: Payer: Self-pay | Admitting: Family

## 2023-02-05 VITALS — BP 105/80 | HR 88 | Temp 98.4°F | Ht 70.0 in | Wt 239.4 lb

## 2023-02-05 DIAGNOSIS — N3001 Acute cystitis with hematuria: Secondary | ICD-10-CM

## 2023-02-05 NOTE — Progress Notes (Signed)
   Assessment & Plan:  Acute cystitis with hematuria Assessment & Plan: Asymptomatic today.  Discussed rechecking urine if symptoms were to recur.  Will follow.       Return precautions given.   Risks, benefits, and alternatives of the medications and treatment plan prescribed today were discussed, and patient expressed understanding.   Education regarding symptom management and diagnosis given to patient on AVS either electronically or printed.  Return for Complete Physical Exam.  [name], FNP  Subjective:    Patient ID: John Snow, male    DOB: Jun 01, 1985, 37 y.o.   MRN: [id number]  CC: John Snow is a 37 y.o. male who presents today for follow up.   HPI: Feels well today No new complaints  Denies dysuria, urinary frequency  He has been making healthier diet choices    Allergies: Decongestant [pseudoephedrine hcl] Current Outpatient Medications on File Prior to Visit  Medication Sig Dispense Refill   Black Currant Seed Oil 500 MG CAPS Take by mouth daily.     cholecalciferol (VITAMIN D) 1000 units tablet Take 1,000 Units by mouth daily.     ciprofloxacin (CIPRO) 500 MG tablet Take 1 tablet (500 mg total) by mouth 2 (two) times daily. 14 tablet 0   No current facility-administered medications on file prior to visit.    Review of Systems  Constitutional:  Negative for chills and fever.  Respiratory:  Negative for cough.   Cardiovascular:  Negative for chest pain and palpitations.  Gastrointestinal:  Negative for nausea and vomiting.      Objective:    BP 105/80   Pulse 88   Temp 98.4 F (36.9 C) (Oral)   Ht 5\' 10"  (1.778 m)   Wt 239 lb 6.4 oz (108.6 kg)   SpO2 98%   BMI 34.35 kg/m  BP Readings from Last 3 Encounters:  02/05/23 105/80  12/21/22 128/88  12/18/22 (!) 144/83   Wt Readings from Last 3 Encounters:  02/05/23 239 lb 6.4 oz (108.6 kg)  12/21/22 244 lb 12.8 oz (111 kg)  11/30/22 230 lb (104.3 kg)    Physical Exam Vitals  reviewed.  Constitutional:      Appearance: He is well-developed.  Cardiovascular:     Rate and Rhythm: Regular rhythm.     Heart sounds: Normal heart sounds.  Pulmonary:     Effort: Pulmonary effort is normal. No respiratory distress.     Breath sounds: Normal breath sounds. No wheezing, rhonchi or rales.  Skin:    General: Skin is warm and dry.  Neurological:     Mental Status: He is alert.  Psychiatric:        Speech: Speech normal.        Behavior: Behavior normal.

## 2023-02-05 NOTE — Patient Instructions (Signed)
Nice to see you!   

## 2023-02-05 NOTE — Assessment & Plan Note (Signed)
Asymptomatic today.  Discussed rechecking urine if symptoms were to recur.  Will follow.

## 2023-04-17 ENCOUNTER — Encounter: Payer: Self-pay | Admitting: Family

## 2023-04-17 NOTE — Telephone Encounter (Signed)
Spoke to pt and scheduled him to see you tomorrow to be tested for flu, covid

## 2023-04-18 ENCOUNTER — Encounter: Payer: Self-pay | Admitting: Family

## 2023-04-18 ENCOUNTER — Ambulatory Visit (INDEPENDENT_AMBULATORY_CARE_PROVIDER_SITE_OTHER): Payer: BC Managed Care – PPO | Admitting: Family

## 2023-04-18 VITALS — BP 148/88 | HR 108 | Temp 102.0°F | Ht 70.0 in | Wt 242.8 lb

## 2023-04-18 DIAGNOSIS — R051 Acute cough: Secondary | ICD-10-CM | POA: Diagnosis not present

## 2023-04-18 DIAGNOSIS — R509 Fever, unspecified: Secondary | ICD-10-CM | POA: Diagnosis not present

## 2023-04-18 LAB — POCT INFLUENZA A/B
Influenza A, POC: NEGATIVE
Influenza B, POC: NEGATIVE

## 2023-04-18 LAB — POCT RAPID STREP A (OFFICE): Rapid Strep A Screen: NEGATIVE

## 2023-04-18 LAB — POC COVID19 BINAXNOW: SARS Coronavirus 2 Ag: NEGATIVE

## 2023-04-18 NOTE — Patient Instructions (Signed)
I suspect viral etiology.  I know that you do not feel well Stay in close touch.  You may continue rotating Tylenol and ibuprofen.  Please try to stay hydrated, popsicles and small frequent sips of water as discussed.  Fever, Adult     A fever is a high body temperature that is 100.63F (38C) or higher. Brief mild or moderate fevers generally have no lasting effects, and they often do not need treatment. Moderate or high fevers can feel uncomfortable and can sometimes be a sign of a serious problem. Fevers can also cause dehydration because the body may sweat, especially if the fever keeps coming back or lasts a long time. You can use a thermometer to check for a fever. Body temperature can change with: Age. Time of day. Where the temperature is taken, such as in the mouth, rectum, ear, under the arm, or on the forehead. Follow these instructions at home: Medicines Take over-the-counter and prescription medicines only as told by your health care provider. Follow instructions on how much medicine to take and how often. If you were prescribed antibiotics, take them as told by your provider. Do not stop using the antibiotic even if you start to feel better. General instructions Watch for any changes in your symptoms. Let your provider know about them. Rest as needed. Drink enough fluid to keep your pee (urine) pale yellow. This helps to prevent dehydration. Bathe or sponge bathe with room-temperature water to help lower your body temperature as needed. Do not use cold water. Do not use too many blankets or wear heavy clothes. Stay home from work and public places for at least 24 hours after your fever is gone. Your fever should be gone without having to use medicines. Contact a health care provider if: You have vomiting or diarrhea that does not get better. You cannot eat or drink without vomiting. You have pain when you pee (urinate). Your symptoms do not get better with treatment or you  have new symptoms. You have a skin rash. You have signs of dehydration, such as: Dark pee, very little pee, or no pee. Cracked lips or dry mouth. Sunken eyes. Sleepiness. Weakness. Get help right away if: You have shortness of breath or trouble breathing. You feel dizzy or you faint. You are confused and do not know the time of day, where you are, or who you are (disoriented). You have severe pain in your abdomen. These symptoms may be an emergency. Get help right away. Call 911. Do not wait to see if the symptoms will go away. Do not drive yourself to the hospital. This information is not intended to replace advice given to you by your health care provider. Make sure you discuss any questions you have with your health care provider. Document Revised: 11/22/2021 Document Reviewed: 11/22/2021 Elsevier Patient Education  2024 [employer].

## 2023-04-18 NOTE — Assessment & Plan Note (Signed)
Fever over the last 3 days with unknown etiology.  Negative COVID, flu and strep throat today.  Patient denies headache and has been  taking ibuprofen and Tylenol.  No significant cough.  Advised likely viral URI and supportive care, adequate hydration is most important.  If symptom persists in the 24 to 48 hours, we discussed proceeding with labs, chest x-ray.  We will stay in close touch

## 2023-04-18 NOTE — Progress Notes (Signed)
Assessment & Plan:  Acute cough -     POC COVID-19 BinaxNow -     POCT Influenza A/B -     POCT rapid strep A  Fever, unspecified fever cause Assessment & Plan: Fever over the last 3 days with unknown etiology.  Negative COVID, flu and strep throat today.  Patient denies headache and has been  taking ibuprofen and Tylenol.  No significant cough.  Advised likely viral URI and supportive care, adequate hydration is most important.  If symptom persists in the 24 to 48 hours, we discussed proceeding with labs, chest x-ray.  We will stay in close touch      Return precautions given.   Risks, benefits, and alternatives of the medications and treatment plan prescribed today were discussed, and patient expressed understanding.   Education regarding symptom management and diagnosis given to patient on AVS either electronically or printed.  No follow-ups on file.  [name], FNP  Subjective:    Patient ID: John Snow, male    DOB: [birth year], 38 y.o.   MRN: [id number]  CC: John Snow is a 38 y.o. male who presents today for an acute visit.    HPI: Complains of fever x 3 days Tmax 102 Onset with scratchy throat . Denies throat pain.  Occasional dry cough.  Endorses congestion when he does cough  Denies abdominal pain, V, diarrhea, dysuria, rash, HA  He is taking ibuprofen and tylenol alternating.   Last tylenol at 3am He is taking Dayquil  Wife is having a "scratchy throat" as well.  He is trying to enough drink water. [place]   No history of lung disease Allergies: Decongestant [pseudoephedrine hcl] Current Outpatient Medications on File Prior to Visit  Medication Sig Dispense Refill   Black Currant Seed Oil 500 MG CAPS Take by mouth daily.     cholecalciferol (VITAMIN D) 1000 units tablet Take 1,000 Units by mouth daily.     ciprofloxacin (CIPRO) 500 MG tablet Take 1 tablet (500 mg total) by mouth 2 (two) times daily. 14  tablet 0   No current facility-administered medications on file prior to visit.    Review of Systems  Constitutional:  Positive for fever. Negative for chills.  HENT:  Positive for congestion. Negative for sinus pressure.   Respiratory:  Positive for cough. Negative for shortness of breath and wheezing.   Cardiovascular:  Negative for chest pain and palpitations.  Gastrointestinal:  Negative for abdominal distention, nausea and vomiting.  Genitourinary:  Negative for dysuria.  Neurological:  Negative for headaches.      Objective:    BP (!) 148/88   Pulse (!) 108   Temp (!) 102 F (38.9 C) (Oral)   Ht 5\' 10"  (1.778 m)   Wt 242 lb 12.8 oz (110.1 kg)   SpO2 96%   BMI 34.84 kg/m   BP Readings from Last 3 Encounters:  04/18/23 (!) 148/88  02/05/23 105/80  12/21/22 128/88   Wt Readings from Last 3 Encounters:  04/18/23 242 lb 12.8 oz (110.1 kg)  02/05/23 239 lb 6.4 oz (108.6 kg)  12/21/22 244 lb 12.8 oz (111 kg)    Physical Exam Vitals reviewed.  Constitutional:      Appearance: He is well-developed.  Cardiovascular:     Rate and Rhythm: Regular rhythm.     Heart sounds: Normal heart sounds.  Pulmonary:     Effort: Pulmonary effort is normal. No respiratory distress.  Breath sounds: Normal breath sounds. No wheezing, rhonchi or rales.  Skin:    General: Skin is warm and dry.  Neurological:     Mental Status: He is alert.  Psychiatric:        Speech: Speech normal.        Behavior: Behavior normal.

## 2023-04-19 ENCOUNTER — Telehealth: Payer: Self-pay | Admitting: Family

## 2023-04-19 NOTE — Telephone Encounter (Signed)
Spoke with pt  'fever has broke' over the night.   98.7 today.    feeling tired but working today.   Cough with a little sputum today.   He doesn't feel CXR is needed at this time.   He will let me know if he needs anything else.

## 2023-04-20 ENCOUNTER — Encounter: Payer: Self-pay | Admitting: Family

## 2023-04-20 ENCOUNTER — Telehealth: Payer: Self-pay | Admitting: Family

## 2023-04-20 DIAGNOSIS — J40 Bronchitis, not specified as acute or chronic: Secondary | ICD-10-CM

## 2023-04-20 MED ORDER — AZITHROMYCIN 250 MG PO TABS
ORAL_TABLET | ORAL | 0 refills | Status: DC
Start: 2023-04-20 — End: 2023-11-21

## 2023-04-20 NOTE — Telephone Encounter (Signed)
Spoke with pt  Cough is not persistent.  Tmax 100.1 yesterday.  Temperature this morning 98 however he took ibuprofen 2 hours ago   CXR ordered and pt may consider going if sxs do not improve after today We agreed to start azithromycin, probiotics.

## 2023-04-22 ENCOUNTER — Ambulatory Visit
Admission: RE | Admit: 2023-04-22 | Discharge: 2023-04-22 | Disposition: A | Discharge status: Home / Self Care | Payer: BC Managed Care – PPO | Source: Ambulatory Visit | Attending: Family

## 2023-04-22 ENCOUNTER — Ambulatory Visit
Admission: RE | Admit: 2023-04-22 | Discharge: 2023-04-22 | Disposition: A | Discharge status: Home / Self Care | Payer: BC Managed Care – PPO | Attending: Family | Admitting: Family

## 2023-04-22 DIAGNOSIS — J40 Bronchitis, not specified as acute or chronic: Secondary | ICD-10-CM | POA: Diagnosis not present

## 2023-04-22 DIAGNOSIS — R509 Fever, unspecified: Secondary | ICD-10-CM | POA: Diagnosis not present

## 2023-04-22 DIAGNOSIS — R059 Cough, unspecified: Secondary | ICD-10-CM | POA: Diagnosis not present

## 2023-04-23 ENCOUNTER — Encounter: Payer: BC Managed Care – PPO | Admitting: Family

## 2023-04-23 ENCOUNTER — Encounter: Payer: Self-pay | Admitting: Family

## 2023-11-20 ENCOUNTER — Ambulatory Visit: Payer: Self-pay

## 2023-11-20 NOTE — Telephone Encounter (Signed)
 FYI Only or Action Required?: FYI only for provider.  Patient was last seen in primary care on 04/18/2023 by Dineen Rollene MATSU, FNP.  Called Nurse Triage reporting Eye Problem.  Symptoms began yesterday.  Interventions attempted: Nothing.  Symptoms are: gradually improving.  Triage Disposition: Home Care  Patient/caregiver understands and will follow disposition?: No, wishes to speak with PCP appointment scheduled  Copied from CRM #[id number]. Topic: Clinical - Red Word Triage >> Nov 20, 2023  2:24 PM [place] J wrote: [facility] that prompted transfer to Nurse Triage: Patient wants to be sure he does not have pink eye. He woke up with his right eye burning and appearing red. Reason for Disposition  [1] Red eye is part of a cold AND [2] no eye pain or blurred vision  Answer Assessment - Initial Assessment Questions 1. LOCATION: Location: What's red, the eyeball or the outer eyelids? (Note: when callers say the eye is red, they usually mean the sclera is red)       Right eye, red scleral 2. REDNESS OF SCLERA: Is the redness in one or both eyes? When did the redness start?      Right eye, sclera pink 3. ONSET: When did the eye become red? (e.g., hours, days)      Last night 4. EYELIDS: Are the eyelids red or swollen? If Yes, ask: How much?      denies 5. VISION: Is there any difficulty seeing clearly?      Mild blur in right eye 6. ITCHING: Does it feel itchy? If so ask: How bad is it (e.g., Scale 1-10; or mild, moderate, severe)     Burning, mild 7. PAIN: Is there any pain? If Yes, ask: How bad is it? (e.g., Scale 1-10; or mild, moderate, severe)     denies 8. CONTACT LENS: Do you wear contacts?     denies 9. CAUSE: What do you think is causing the redness?     Unsure, maybe pink ere 10. OTHER SYMPTOMS: Do you have any other symptoms? (e.g., fever, runny nose, cough, vomiting)       Nose stuffy/runny  Has not been in contact with anyone with  conjunctivitis  Protocols used: Eye - Red Without Pus-A-AH

## 2023-11-21 ENCOUNTER — Ambulatory Visit (INDEPENDENT_AMBULATORY_CARE_PROVIDER_SITE_OTHER)

## 2023-11-21 VITALS — BP 102/80 | HR 77 | Temp 98.7°F | Ht 70.0 in | Wt 261.8 lb

## 2023-11-21 DIAGNOSIS — H1011 Acute atopic conjunctivitis, right eye: Secondary | ICD-10-CM | POA: Insufficient documentation

## 2023-11-21 DIAGNOSIS — H52209 Unspecified astigmatism, unspecified eye: Secondary | ICD-10-CM | POA: Diagnosis not present

## 2023-11-21 MED ORDER — OLOPATADINE HCL 0.2 % OP SOLN
1.0000 [drp] | Freq: Every day | OPHTHALMIC | 0 refills | Status: DC | PRN
Start: 2023-11-21 — End: 2024-01-15

## 2023-11-21 NOTE — Assessment & Plan Note (Signed)
 Astigmatism and refractive error potentially contributing to eye strain and blurry vision. Glasses not worn as prescribed, prolonged computer use may worsen symptoms. Advised follow-up with an eye doctor to reassess vision and update glasses prescription. Encouraged wearing prescribed glasses to reduce eye strain and improve vision clarity.

## 2023-11-21 NOTE — Patient Instructions (Addendum)
-   Use Refresh artificial eye drop: 1 drop twice a day for one week then as needed after that. You can get this over the counter at any pharmacy/grocery store.  - If you start to have eye itching, runny eyes try Pataday  eyedrop, one drop to both eyes as needed daily. I am sending a prescription for this to your pharmacy but your insurance may not cover for it. You can also get this over the counter.   - Please schedule an appointment for eye exam with your eye doctor. You can try [facility]: phone number: [phone]

## 2023-11-21 NOTE — Assessment & Plan Note (Addendum)
 Acute right eye redness, tearing, and burning likely due to allergic conjunctivitis or dry eye syndrome. Symptoms resolved spontaneously. Prescribed Pataday  eye drops, one drop daily as needed for itching, eye drainage.   Recommended Refresh Artificial Eye Drops, one drop twice daily for one week, then as needed. Advised follow-up with an eye doctor for further evaluation. Patient provided with phone number to reach out to [facility]. He will also check with his insurance on vision coverage and where he can go for eye exam. Instructed to seek emergency care if sudden vision loss, intense headache, nausea, or specific redness patterns occur.

## 2023-11-21 NOTE — Progress Notes (Signed)
 Acute Office Visit  Subjective:    Patient ID: John Snow, male    DOB: 11/11/1985, 38 y.o.   MRN: [id number]  Chief Complaint  Patient presents with   Eye Problem   Patient is in today for following acute concern: HPI Discussed the use of AI scribe software for clinical note transcription with the patient, who gave verbal consent to proceed.  History of Present Illness John Snow is a 38 year old male who presents with redness and tearing of the right eye.  Yesterday morning, he woke up with his right eye being red, tearing, and burning. He describes the sensation as if something was in his eye, making it feel heavy. Throughout the day, the vision in his right eye was slightly blurry when he closed his left eye. By the evening, the symptoms began to improve, and by this morning, his eye felt back to normal.  No discharge or drainage from the eye, and no crusting on the eyelids. The redness was both around the dark part of the eye and all over. He does not wear contact lenses and has not used any eye drops. He is supposed to wear glasses but does not wear them regularly.  He recalls having a runny nose yesterday but denies any sore throat, headache, nausea, or vomiting. He works at [employer] and spends a lot of time in front of a computer. He last had his eyes checked about a year ago and was told he has astigmatism. He is supposed to be waring glasses but he has not been doing so.    He does not take any medications and has no known seasonal allergies, although he experienced a runny nose yesterday. No headache, nausea, sore throat, cough.    ROS As per HPI    Objective:    BP 102/80 (BP Location: Right Arm, Patient Position: Sitting, Cuff Size: Normal)   Pulse 77   Temp 98.7 F (37.1 C) (Oral)   Ht 5' 10 (1.778 m)   Wt 261 lb 12.8 oz (118.8 kg)   SpO2 98%   BMI 37.56 kg/m    Physical Exam Constitutional:      General: He is not in acute distress. HENT:      Head: Normocephalic and atraumatic.     Right Ear: Tympanic membrane normal. There is no impacted cerumen.     Left Ear: Tympanic membrane normal. There is no impacted cerumen.     Mouth/Throat:     Mouth: Mucous membranes are moist.     Pharynx: Oropharynx is clear. No oropharyngeal exudate.  Eyes:     Extraocular Movements: Extraocular movements intact.     Conjunctiva/sclera: Conjunctivae normal.     Pupils: Pupils are equal, round, and reactive to light.  Cardiovascular:     Rate and Rhythm: Normal rate.     Pulses: Normal pulses.     Heart sounds: Normal heart sounds. No murmur heard. Pulmonary:     Effort: Pulmonary effort is normal.     Breath sounds: Normal breath sounds.  Musculoskeletal:     Cervical back: Neck supple.  Lymphadenopathy:     Cervical: No cervical adenopathy.  Skin:    General: Skin is warm.  Neurological:     Mental Status: He is alert and oriented to person, place, and time.  Psychiatric:        Mood and Affect: Mood normal.     No results found for any visits on 11/21/23.  Assessment & Plan:  Patient is a pleasant 38 year old male presenting for evaluation of right eye tearing, redness for one day which is now resolved. D/D includes dry eyes, allergic rhinitis, bacterial conjunctivitis.  Allergic conjunctivitis of right eye Assessment & Plan: Acute right eye redness, tearing, and burning likely due to allergic conjunctivitis or dry eye syndrome. Symptoms resolved spontaneously. Prescribed Pataday  eye drops, one drop daily as needed for itching, eye drainage.   Recommended Refresh Artificial Eye Drops, one drop twice daily for one week, then as needed. Advised follow-up with an eye doctor for further evaluation. Patient provided with phone number to reach out to [facility]. He will also check with his insurance on vision coverage and where he can go for eye exam. Instructed to seek emergency care if sudden vision loss, intense headache,  nausea, or specific redness patterns occur.   Orders: -     Olopatadine  HCl; Apply 1 drop to eye daily as needed.  Dispense: 3.5 mL; Refill: 0  Astigmatism, unspecified laterality, unspecified type Assessment & Plan: Astigmatism and refractive error potentially contributing to eye strain and blurry vision. Glasses not worn as prescribed, prolonged computer use may worsen symptoms. Advised follow-up with an eye doctor to reassess vision and update glasses prescription. Encouraged wearing prescribed glasses to reduce eye strain and improve vision clarity.     Return in about 8 weeks (around 01/16/2024) for Chronic follow up and physical with PCP.  Luke Shade, MD

## 2024-01-15 ENCOUNTER — Encounter: Payer: Self-pay | Admitting: Family

## 2024-01-15 ENCOUNTER — Ambulatory Visit: Payer: Self-pay | Admitting: Family

## 2024-01-15 ENCOUNTER — Ambulatory Visit (INDEPENDENT_AMBULATORY_CARE_PROVIDER_SITE_OTHER): Admitting: Family

## 2024-01-15 VITALS — BP 112/78 | HR 79 | Temp 97.9°F | Ht 70.0 in | Wt 257.4 lb

## 2024-01-15 DIAGNOSIS — Z0000 Encounter for general adult medical examination without abnormal findings: Secondary | ICD-10-CM | POA: Diagnosis not present

## 2024-01-15 LAB — TSH: TSH: 0.95 u[IU]/mL (ref 0.35–5.50)

## 2024-01-15 LAB — LIPID PANEL
Cholesterol: 205 mg/dL — ABNORMAL HIGH (ref 0–200)
HDL: 71.1 mg/dL (ref >39.00)
LDL Cholesterol: 122 mg/dL — ABNORMAL HIGH (ref 0–99)
NonHDL: 134.29
Total CHOL/HDL Ratio: 3
Triglycerides: 59 mg/dL (ref 0.0–149.0)
VLDL: 11.8 mg/dL (ref 0.0–40.0)

## 2024-01-15 LAB — CBC WITH DIFFERENTIAL/PLATELET
Basophils Absolute: 0.1 K/uL (ref 0.0–0.1)
Basophils Relative: 1.5 % (ref 0.0–3.0)
Eosinophils Absolute: 0.1 K/uL (ref 0.0–0.7)
Eosinophils Relative: 1.4 % (ref 0.0–5.0)
HCT: 50.3 % (ref 39.0–52.0)
Hemoglobin: 16.8 g/dL (ref 13.0–17.0)
Lymphocytes Relative: 28.9 % (ref 12.0–46.0)
Lymphs Abs: 1.5 K/uL (ref 0.7–4.0)
MCHC: 33.5 g/dL (ref 30.0–36.0)
MCV: 92.1 fl (ref 78.0–100.0)
Monocytes Absolute: 0.5 K/uL (ref 0.1–1.0)
Monocytes Relative: 9.9 % (ref 3.0–12.0)
Neutro Abs: 2.9 K/uL (ref 1.4–7.7)
Neutrophils Relative %: 58.3 % (ref 43.0–77.0)
Platelets: 254 K/uL (ref 150.0–400.0)
RBC: 5.46 Mil/uL (ref 4.22–5.81)
RDW: 13.5 % (ref 11.5–15.5)
WBC: 5.1 K/uL (ref 4.0–10.5)

## 2024-01-15 LAB — COMPREHENSIVE METABOLIC PANEL WITH GFR
ALT: 17 U/L (ref 0–53)
AST: 21 U/L (ref 0–37)
Albumin: 4.8 g/dL (ref 3.5–5.2)
Alkaline Phosphatase: 38 U/L — ABNORMAL LOW (ref 39–117)
BUN: 14 mg/dL (ref 6–23)
CO2: 28 meq/L (ref 19–32)
Calcium: 9.7 mg/dL (ref 8.4–10.5)
Chloride: 100 meq/L (ref 96–112)
Creatinine, Ser: 1.07 mg/dL (ref 0.40–1.50)
GFR: 87.41 mL/min (ref >60.00)
Glucose, Bld: 92 mg/dL (ref 70–99)
Potassium: 4.9 meq/L (ref 3.5–5.1)
Sodium: 137 meq/L (ref 135–145)
Total Bilirubin: 0.8 mg/dL (ref 0.2–1.2)
Total Protein: 8 g/dL (ref 6.0–8.3)

## 2024-01-15 LAB — PSA: PSA: 0.43 ng/mL (ref 0.10–4.00)

## 2024-01-15 NOTE — Assessment & Plan Note (Addendum)
 Encouraged continued exercise.  Discussed symptoms of enlarged prostate, prostate cancer due to family history.  Fortunately asymptomatic at this time.  We opted to continue annual PSA screening.  He declines consult with urology for further advice due to family history.

## 2024-01-15 NOTE — Progress Notes (Signed)
 Assessment & Plan:  Annual physical exam Assessment & Plan: Encouraged continued exercise.  Discussed symptoms of enlarged prostate, prostate cancer due to family history.  Fortunately asymptomatic at this time.  We opted to continue annual PSA screening.  He declines consult with urology for further advice due to family history.  Orders: -     CBC with Differential/Platelet -     Comprehensive metabolic panel with GFR -     Lipid panel -     PSA -     TSH     Return precautions given.   Risks, benefits, and alternatives of the medications and treatment plan prescribed today were discussed, and patient expressed understanding.   Education regarding symptom management and diagnosis given to patient on AVS either electronically or printed.  No follow-ups on file.  Rollene Northern, FNP  Subjective:    Patient ID: John Snow, male    DOB: October 15, 1985, 38 y.o.   MRN: [id number]  CC: John Snow is a 38 y.o. male who presents today for physical exam.    HPI:  Feels well today; no complaints.  He exercises regularly with the elliptical, treadmill.  He stays physically active.  Denies chest pain, shortness of breath.  Normal     No first-degree relatives with colon cancer Prostate Cancer Screening: Father has history of prostate cancer.  He denies decreased urinary stream, urinary frequency.  Lung Cancer Screening: No 30 year pack year history and > 50 years to 80 years.    Immunizations       Tetanus - due; declines Hepatitis B vaccine-No h/o liver disease, IV drug use, blood transfusion, nor work around bodily fluids.  Patient suspects that they completed Hep B vaccine series in the past. We agreed to defer screening for Hep B immunity.   Exercise: Gets regular exercise.   Alcohol use: Occasional Smoking/tobacco use: Nonsmoker.    Health Maintenance  Topic Date Due   COVID-19 Vaccine (1 - 2025-26 season) Never done   Flu Shot  06/03/2024*   DTaP/Tdap/Td  vaccine (1 - Tdap) 01/14/2025*   Hepatitis C Screening  Completed   HIV Screening  Completed   Pneumococcal Vaccine  Aged Out   Meningitis B Vaccine  Aged Out   Hepatitis B Vaccine  Discontinued   HPV Vaccine  Discontinued  *Topic was postponed. The date shown is not the original due date.     ALLERGIES: Decongestant [pseudoephedrine hcl]  No current outpatient medications on file prior to visit.   No current facility-administered medications on file prior to visit.    Review of Systems  Constitutional:  Negative for chills and fever.  Respiratory:  Negative for cough.   Cardiovascular:  Negative for chest pain and palpitations.  Gastrointestinal:  Negative for nausea and vomiting.      Objective:    BP 112/78   Pulse 79   Temp 97.9 F (36.6 C) (Oral)   Ht 5' 10 (1.778 m)   Wt 257 lb 6.4 oz (116.8 kg)   SpO2 99%   BMI 36.93 kg/m   BP Readings from Last 3 Encounters:  01/15/24 112/78  11/21/23 102/80  04/18/23 (!) 148/88   Wt Readings from Last 3 Encounters:  01/15/24 257 lb 6.4 oz (116.8 kg)  11/21/23 261 lb 12.8 oz (118.8 kg)  04/18/23 242 lb 12.8 oz (110.1 kg)    Physical Exam Vitals reviewed.  Constitutional:      Appearance: Normal appearance. He is well-developed.  Neck:  Thyroid : No thyroid  mass or thyromegaly.  Cardiovascular:     Rate and Rhythm: Regular rhythm.     Heart sounds: Normal heart sounds.  Pulmonary:     Effort: Pulmonary effort is normal. No respiratory distress.     Breath sounds: Normal breath sounds. No wheezing, rhonchi or rales.  Abdominal:     General: Bowel sounds are normal. There is no distension.     Palpations: Abdomen is soft. Abdomen is not rigid. There is no fluid wave or mass.     Tenderness: There is no abdominal tenderness. There is no guarding or rebound. Negative signs include Murphy's sign and McBurney's sign.  Lymphadenopathy:     Head:     Right side of head: No submental, submandibular, tonsillar,  preauricular, posterior auricular or occipital adenopathy.     Left side of head: No submental, submandibular, tonsillar, preauricular, posterior auricular or occipital adenopathy.     Cervical: No cervical adenopathy.  Skin:    General: Skin is warm and dry.  Neurological:     Mental Status: He is alert.  Psychiatric:        Speech: Speech normal.        Behavior: Behavior normal.

## 2024-05-08 ENCOUNTER — Encounter: Admitting: Family
